# Patient Record
Sex: Male | Born: 1975
Health system: Southern US, Community
[De-identification: ages and names within clinical notes are randomized; demographics above are authoritative.]

## PROBLEM LIST (undated history)

## (undated) DIAGNOSIS — I10 Essential (primary) hypertension: Secondary | ICD-10-CM

## (undated) DIAGNOSIS — F419 Anxiety disorder, unspecified: Secondary | ICD-10-CM

## (undated) HISTORY — PX: TONSILLECTOMY: SHX5217

## (undated) HISTORY — PX: TONSILLECTOMY: SUR1361

## (undated) HISTORY — PX: ANKLE SURGERY: SHX546

## (undated) HISTORY — DX: Essential (primary) hypertension: I10

## (undated) HISTORY — DX: Anxiety disorder, unspecified: F41.9

---

## 2007-08-29 DIAGNOSIS — M549 Dorsalgia, unspecified: Secondary | ICD-10-CM | POA: Insufficient documentation

## 2011-02-12 DIAGNOSIS — G4761 Periodic limb movement disorder: Secondary | ICD-10-CM | POA: Insufficient documentation

## 2011-02-12 DIAGNOSIS — G4733 Obstructive sleep apnea (adult) (pediatric): Secondary | ICD-10-CM | POA: Insufficient documentation

## 2016-09-11 ENCOUNTER — Ambulatory Visit (INDEPENDENT_AMBULATORY_CARE_PROVIDER_SITE_OTHER): Payer: BLUE CROSS/BLUE SHIELD | Admitting: Family

## 2016-09-11 ENCOUNTER — Encounter: Payer: Self-pay | Admitting: Family

## 2016-09-11 VITALS — BP 143/98 | HR 83 | Temp 98.5°F | Ht 66.25 in | Wt 270.2 lb

## 2016-09-11 DIAGNOSIS — F17200 Nicotine dependence, unspecified, uncomplicated: Secondary | ICD-10-CM | POA: Diagnosis not present

## 2016-09-11 DIAGNOSIS — R350 Frequency of micturition: Secondary | ICD-10-CM | POA: Diagnosis not present

## 2016-09-11 DIAGNOSIS — R030 Elevated blood-pressure reading, without diagnosis of hypertension: Secondary | ICD-10-CM

## 2016-09-11 DIAGNOSIS — M545 Low back pain, unspecified: Secondary | ICD-10-CM | POA: Insufficient documentation

## 2016-09-11 DIAGNOSIS — E669 Obesity, unspecified: Secondary | ICD-10-CM

## 2016-09-11 DIAGNOSIS — G8929 Other chronic pain: Secondary | ICD-10-CM

## 2016-09-11 LAB — URINALYSIS, COMPLETE
BILIRUBIN UA: NEGATIVE
Glucose, UA: NEGATIVE
KETONES UA: NEGATIVE
LEUKOCYTES UA: NEGATIVE
Nitrite, UA: NEGATIVE
Specific Gravity, UA: >1.03 — ABNORMAL HIGH (ref 1.005–1.030)
Urobilinogen, Ur: 0.2 mg/dL (ref 0.2–1.0)
pH, UA: 5.5 (ref 5.0–7.5)

## 2016-09-11 LAB — MICROSCOPIC EXAMINATION: BACTERIA UA: NONE SEEN

## 2016-09-11 MED ORDER — CYCLOBENZAPRINE HCL 10 MG PO TABS: 10.0000 mg | ORAL_TABLET | Freq: Three times a day (TID) | ORAL | 0 refills | Status: DC | PRN

## 2016-09-11 MED ORDER — NAPROXEN 500 MG PO TABS: 500.0000 mg | ORAL_TABLET | Freq: Two times a day (BID) | ORAL | 1 refills | Status: DC

## 2016-09-11 MED ORDER — TAMSULOSIN HCL 0.4 MG PO CAPS: 0.4000 mg | ORAL_CAPSULE | Freq: Every day | ORAL | 1 refills | Status: DC

## 2016-09-11 NOTE — Patient Instructions (Signed)
Urinary Frequency, Adult Urinary frequency means urinating more often than usual. People with urinary frequency urinate at least 8 times in 24 hours, even if they drink a normal amount of fluid. Although they urinate more often than normal, the total amount of urine produced in a day may be normal. Urinary frequency is also called pollakiuria. What are the causes? This condition may be caused by:  A urinary tract infection.  Obesity.  Bladder problems, such as bladder stones.  Caffeine or alcohol.  Eating food or drinking fluids that irritate the bladder. These include coffee, tea, soda, artificial sweeteners, citrus, tomato-based foods, and chocolate.  Certain medicines, such as medicines that help the body get rid of extra fluid (diuretics).  Muscle or nerve weakness.  Overactive bladder.  Chronic diabetes.  Interstitial cystitis.  In men, problems with the prostate, such as an enlarged prostate.  In women, pregnancy. In some cases, the cause may not be known. What increases the risk? This condition is more likely to develop in:  Women who have gone through menopause.  Men with prostate problems.  People with a disease or injury that affects the nerves or spinal cord.  People who have or have had a condition that affects the brain, such as a stroke. What are the signs or symptoms? Symptoms of this condition include:  Feeling an urgent need to urinate often. The stress and anxiety of needing to find a bathroom quickly can make this urge worse.  Urinating 8 or more times in 24 hours.  Urinating as often as every 1 to 2 hours. How is this diagnosed? This condition is diagnosed based on your symptoms, your medical history, and a physical exam. You may have tests, such as:  Blood tests.  Urine tests.  Imaging tests, such as X-rays or ultrasounds.  A bladder test.  A test of your neurological system. This is the body system that senses the need to urinate.  A  test to check for problems in the urethra and bladder called cystoscopy. You may also be asked to keep a bladder diary. A bladder diary is a record of what you eat and drink, how often you urinate, and how much you urinate. You may need to see a health care provider who specializes in conditions of the urinary tract (urologist) or kidneys (nephrologist). How is this treated? Treatment for this condition depends on the cause. Sometimes the condition goes away on its own and treatment is not necessary. If treatment is needed, it may include:  Taking medicine.  Learning exercises that strengthen the muscles that help control urination.  Following a bladder training program. This may include:  Learning to delay going to the bathroom.  Double urinating (voiding). This helps if you are not completely emptying your bladder.  Scheduled voiding.  Making diet changes, such as:  Avoiding caffeine.  Drinking fewer fluids, especially alcohol.  Not drinking in the evening.  Not having foods or drinks that may irritate the bladder.  Eating foods that help prevent or ease constipation. Constipation can make this condition worse.  Having the nerves in your bladder stimulated. There are two options for stimulating the nerves to your bladder:  Outpatient electrical nerve stimulation. This is done by your health care provider.  Surgery to implant a bladder pacemaker. The pacemaker helps to control the urge to urinate. Follow these instructions at home:  Keep a bladder diary if told to by your health care provider.  Take over-the-counter and prescription medicines only as   told by your health care provider.  Do any exercises as told by your health care provider.  Follow a bladder training program as told by your health care provider.  Make any recommended diet changes.  Keep all follow-up visits as told by your health care provider. This is important. Contact a health care provider  if:  You start urinating more often.  You feel pain or irritation when you urinate.  You notice blood in your urine.  Your urine looks cloudy.  You develop a fever.  You begin vomiting. Get help right away if:  You are unable to urinate. This information is not intended to replace advice given to you by your health care provider. Make sure you discuss any questions you have with your health care provider. Document Released: 04/04/2009 Document Revised: 07/10/2015 Document Reviewed: 01/02/2015 Elsevier Interactive Patient Education  2017 [employer]  

## 2016-09-11 NOTE — Progress Notes (Signed)
Subjective:    Patient ID: Jonathan Moses, male    DOB: 05-Jun-1976, 41 y.o.   MRN: [id number]  Pt presents to the office today to establish care. PT complaining of urinary frequency and has taken flomax in the past, but has been out of it for the last month or so. PT's BP is elevated today, but states he has never had elevated BP in the past.  Back Pain  This is a new problem. The current episode started more than 1 year ago. The problem occurs intermittently. The problem has been waxing and waning since onset. The pain is present in the lumbar spine. The quality of the pain is described as aching. The pain does not radiate. The pain is at a severity of 7/10. The pain is moderate. The symptoms are aggravated by standing and bending. Pertinent negatives include no bladder incontinence, bowel incontinence, leg pain, numbness or weakness. Risk factors include obesity. He has tried NSAIDs for the symptoms. The treatment provided mild relief.  Urinary Frequency   This is a chronic problem. The current episode started more than 1 year ago. The problem occurs intermittently. The problem has been waxing and waning. The patient is experiencing no pain. Associated symptoms include frequency. Pertinent negatives include no hematuria, hesitancy, nausea, urgency or vomiting. Treatments tried: flomax. The treatment provided mild relief.      Review of Systems  Gastrointestinal: Negative for bowel incontinence, nausea and vomiting.  Genitourinary: Positive for frequency. Negative for bladder incontinence, hematuria, hesitancy and urgency.  Musculoskeletal: Positive for back pain.  Neurological: Negative for weakness and numbness.  All other systems reviewed and are negative.  Family History  Problem Relation Age of Onset  . Diabetes Mother   . Hypertension Mother   . Diabetes Father   . Hypertension Father     Social History   Social History  . Marital status: Single    Spouse name: N/A  .  Number of children: N/A  . Years of education: N/A   Social History Main Topics  . Smoking status: Current Every Day Smoker  . Smokeless tobacco: Never Used  . Alcohol use Yes  . Drug use: No  . Sexual activity: Not Asked   Other Topics Concern  . None   Social History Narrative  . None        Objective:   Physical Exam  Constitutional: He is oriented to person, place, and time. He appears well-developed and well-nourished. No distress.  Obese   HENT:  Head: Normocephalic.  Right Ear: External ear normal.  Left Ear: External ear normal.  Mouth/Throat: Oropharynx is clear and moist.  Eyes: Pupils are equal, round, and reactive to light. Right eye exhibits no discharge. Left eye exhibits no discharge.  Neck: Normal range of motion. Neck supple. No thyromegaly present.  Cardiovascular: Normal rate, regular rhythm, normal heart sounds and intact distal pulses.   No murmur heard. Pulmonary/Chest: Effort normal and breath sounds normal. No respiratory distress. He has no wheezes.  Abdominal: Soft. Bowel sounds are normal. He exhibits no distension. There is no tenderness.  Musculoskeletal: Normal range of motion. He exhibits no edema or tenderness.  Pain in lower back with flexion  Neurological: He is alert and oriented to person, place, and time.  Skin: Skin is warm and dry. No rash noted. No erythema.  Psychiatric: He has a normal mood and affect. His behavior is normal. Judgment and thought content normal.  Vitals reviewed.     BP Marland Kitchen)  143/98   Pulse 83   Temp 98.5 F (36.9 C) (Oral)   Ht 5' 6.25" (1.683 m)   Wt 270 lb 3.2 oz (122.6 kg)   BMI 43.28 kg/m      Assessment & Plan:  1. Urine frequency - Urinalysis, Complete - tamsulosin (FLOMAX) 0.4 MG CAPS capsule; Take 1 capsule (0.4 mg total) by mouth daily.  Dispense: 90 capsule; Refill: 1 - CMP14+EGFR  2. Current smoker - CMP14+EGFR  3. Obesity (BMI 30-39.9) - CMP14+EGFR  4. Chronic bilateral low back  pain without sciatica -Rest -Ice and heat - Urinalysis, Complete - cyclobenzaprine (FLEXERIL) 10 MG tablet; Take 1 tablet (10 mg total) by mouth 3 (three) times daily as needed for muscle spasms.  Dispense: 30 tablet; Refill: 0 - naproxen (NAPROSYN) 500 MG tablet; Take 1 tablet (500 mg total) by mouth 2 (two) times daily with a meal.  Dispense: 60 tablet; Refill: 1 - CMP14+EGFR  5. Elevated blood pressure reading - CMP14+EGFR   Continue all meds Labs pending Health Maintenance reviewed Diet and exercise encouraged RTO as needed and encouraged to make [place], FNP

## 2016-09-12 LAB — CMP14+EGFR
A/G RATIO: 1.2 (ref 1.2–2.2)
ALT: 49 IU/L — AB (ref 0–44)
AST: 36 IU/L (ref 0–40)
Albumin: 4.1 g/dL (ref 3.5–5.5)
Alkaline Phosphatase: 101 IU/L (ref 39–117)
BUN/Creatinine Ratio: 9 (ref 9–20)
BUN: 7 mg/dL (ref 6–20)
Bilirubin Total: 0.3 mg/dL (ref 0.0–1.2)
CHLORIDE: 102 mmol/L (ref 96–106)
CO2: 26 mmol/L (ref 18–29)
Calcium: 9.2 mg/dL (ref 8.7–10.2)
Creatinine, Ser: 0.76 mg/dL (ref 0.76–1.27)
GFR calc non Af Amer: 115 mL/min/{1.73_m2} (ref >59)
GFR, EST AFRICAN AMERICAN: 133 mL/min/{1.73_m2} (ref >59)
GLOBULIN, TOTAL: 3.4 g/dL (ref 1.5–4.5)
Glucose: 89 mg/dL (ref 65–99)
POTASSIUM: 4.3 mmol/L (ref 3.5–5.2)
SODIUM: 142 mmol/L (ref 134–144)
Total Protein: 7.5 g/dL (ref 6.0–8.5)

## 2016-10-31 ENCOUNTER — Other Ambulatory Visit: Payer: Self-pay | Admitting: Family

## 2016-10-31 DIAGNOSIS — G8929 Other chronic pain: Secondary | ICD-10-CM

## 2016-10-31 DIAGNOSIS — M545 Low back pain: Principal | ICD-10-CM

## 2016-11-06 ENCOUNTER — Encounter: Payer: Self-pay | Admitting: *Deleted

## 2016-12-14 ENCOUNTER — Encounter: Payer: BLUE CROSS/BLUE SHIELD | Admitting: Family

## 2016-12-15 ENCOUNTER — Other Ambulatory Visit: Payer: Self-pay | Admitting: Family

## 2016-12-15 ENCOUNTER — Encounter: Payer: Self-pay | Admitting: Family

## 2016-12-15 ENCOUNTER — Ambulatory Visit (INDEPENDENT_AMBULATORY_CARE_PROVIDER_SITE_OTHER): Payer: BLUE CROSS/BLUE SHIELD | Admitting: Family

## 2016-12-15 ENCOUNTER — Ambulatory Visit (INDEPENDENT_AMBULATORY_CARE_PROVIDER_SITE_OTHER): Payer: BLUE CROSS/BLUE SHIELD

## 2016-12-15 VITALS — BP 153/103 | HR 94 | Temp 98.3°F | Ht 66.25 in | Wt 270.2 lb

## 2016-12-15 DIAGNOSIS — Z0000 Encounter for general adult medical examination without abnormal findings: Secondary | ICD-10-CM | POA: Diagnosis not present

## 2016-12-15 DIAGNOSIS — R0602 Shortness of breath: Secondary | ICD-10-CM | POA: Diagnosis not present

## 2016-12-15 DIAGNOSIS — F17200 Nicotine dependence, unspecified, uncomplicated: Secondary | ICD-10-CM

## 2016-12-15 DIAGNOSIS — M545 Low back pain: Secondary | ICD-10-CM

## 2016-12-15 DIAGNOSIS — Z23 Encounter for immunization: Secondary | ICD-10-CM | POA: Diagnosis not present

## 2016-12-15 DIAGNOSIS — G8929 Other chronic pain: Secondary | ICD-10-CM

## 2016-12-15 DIAGNOSIS — I10 Essential (primary) hypertension: Secondary | ICD-10-CM

## 2016-12-15 MED ORDER — CYCLOBENZAPRINE HCL 10 MG PO TABS: 10.0000 mg | ORAL_TABLET | Freq: Three times a day (TID) | ORAL | 2 refills | Status: DC | PRN

## 2016-12-15 MED ORDER — LISINOPRIL 20 MG PO TABS: 20.0000 mg | ORAL_TABLET | Freq: Every day | ORAL | 3 refills | Status: DC

## 2016-12-15 MED ORDER — NAPROXEN 500 MG PO TABS: 500.0000 mg | ORAL_TABLET | Freq: Two times a day (BID) | ORAL | 3 refills | Status: DC

## 2016-12-15 NOTE — Patient Instructions (Signed)

## 2016-12-15 NOTE — Progress Notes (Signed)
Subjective:    Patient ID: Jonathan Moses, male    DOB: 02-27-1976, 41 y.o.   MRN: [id number]  Pt presents to the office today for CPE.  Hypertension  This is a new problem. The current episode started more than 1 month ago. The problem is unchanged. The problem is uncontrolled. Associated symptoms include shortness of breath. Pertinent negatives include no palpitations or peripheral edema. Risk factors for coronary artery disease include obesity and family history. Past treatments include nothing. The current treatment provides no improvement. There is no history of kidney disease, CAD/MI, CVA or heart failure.  Back Pain  This is a chronic problem. The current episode started more than 1 year ago. The problem occurs intermittently. The problem has been waxing and waning since onset. The pain is present in the lumbar spine. The quality of the pain is described as aching. The pain is at a severity of 7/10. The pain is moderate. Pertinent negatives include no bladder incontinence or bowel incontinence. He has tried muscle relaxant for the symptoms. The treatment provided mild relief.  Benign Prostatic Hypertrophy  This is a recurrent problem. The current episode started more than 1 year ago. The problem has been waxing and waning since onset. Irritative symptoms include frequency and nocturia (1). Obstructive symptoms include a slower stream and straining.      Review of Systems  Respiratory: Positive for shortness of breath.   Cardiovascular: Negative for palpitations.  Gastrointestinal: Negative for bowel incontinence.  Genitourinary: Positive for frequency and nocturia (1). Negative for bladder incontinence.  Musculoskeletal: Positive for back pain.  All other systems reviewed and are negative.      Objective:   Physical Exam  Constitutional: He is oriented to person, place, and time. He appears well-developed and well-nourished. No distress.  HENT:  Head: Normocephalic.  Right  Ear: External ear normal.  Left Ear: External ear normal.  Nose: Nose normal.  Mouth/Throat: Oropharynx is clear and moist.  Eyes: Pupils are equal, round, and reactive to light. Right eye exhibits no discharge. Left eye exhibits no discharge.  Neck: Normal range of motion. Neck supple. No thyromegaly present.  Cardiovascular: Normal rate, regular rhythm, normal heart sounds and intact distal pulses.   No murmur heard. Pulmonary/Chest: Effort normal and breath sounds normal. No respiratory distress. He has no wheezes.  Abdominal: Soft. Bowel sounds are normal. He exhibits no distension. There is no tenderness.  Musculoskeletal: Normal range of motion. He exhibits no edema or tenderness.  Neurological: He is alert and oriented to person, place, and time. He has normal reflexes. No cranial nerve deficit.  Skin: Skin is warm and dry. No rash noted. No erythema.  Psychiatric: He has a normal mood and affect. His behavior is normal. Judgment and thought content normal.  Vitals reviewed.    BP (!) 153/103   Pulse 94   Temp 98.3 F (36.8 C) (Oral)   Ht 5' 6.25" (1.683 m)   Wt 270 lb 3.2 oz (122.6 kg)   BMI 43.28 kg/m      Assessment & Plan:  1. Annual physical exam - CMP14+EGFR - Lipid panel - CBC with Differential/Platelet - VITAMIN D 25 Hydroxy (Vit-D Deficiency, Fractures) - Thyroid Panel With TSH - PSA, total and free - DG Chest 2 View; Future  2. Current smoker - CMP14+EGFR - DG Chest 2 View; Future  3. Chronic bilateral low back pain without sciatica - CMP14+EGFR  4. Morbid obesity (HCC) - CMP14+EGFR  5. SOB (shortness of breath) -  DG Chest 2 View; Future  6. Essential hypertension -Pt started on lisinopril 20 mg today -Dash diet information given -Exercise encouraged - Stress Management  -Continue current meds -RTO in 2 weeks - lisinopril (PRINIVIL,ZESTRIL) 20 MG tablet; Take 1 tablet (20 mg total) by mouth daily.  Dispense: 90 tablet; Refill: 3   Continue  all meds Labs pending Health Maintenance reviewed Diet and exercise encouraged RTO 2 weeks to recheck HTN  Christy Hawks, FNP  

## 2016-12-15 NOTE — Addendum Note (Signed)
Addended by: Jannifer RodneyHAWKS, Nasiah Polinsky A on: 12/15/2016 03:46 PM   Modules accepted: Orders

## 2016-12-15 NOTE — Addendum Note (Signed)
Addended by: Almeta MonasSTONE, Bonne Whack M on: 12/15/2016 04:11 PM   Modules accepted: Orders

## 2016-12-16 ENCOUNTER — Other Ambulatory Visit: Payer: Self-pay | Admitting: Family

## 2016-12-16 DIAGNOSIS — E559 Vitamin D deficiency, unspecified: Secondary | ICD-10-CM | POA: Insufficient documentation

## 2016-12-16 LAB — CMP14+EGFR
ALT: 66 IU/L — AB (ref 0–44)
AST: 50 IU/L — AB (ref 0–40)
Albumin/Globulin Ratio: 1 — ABNORMAL LOW (ref 1.2–2.2)
Albumin: 3.7 g/dL (ref 3.5–5.5)
Alkaline Phosphatase: 105 IU/L (ref 39–117)
BUN/Creatinine Ratio: 11 (ref 9–20)
BUN: 9 mg/dL (ref 6–20)
Bilirubin Total: 0.3 mg/dL (ref 0.0–1.2)
CALCIUM: 8.9 mg/dL (ref 8.7–10.2)
CO2: 21 mmol/L (ref 20–29)
CREATININE: 0.79 mg/dL (ref 0.76–1.27)
Chloride: 101 mmol/L (ref 96–106)
GFR calc non Af Amer: 113 mL/min/{1.73_m2} (ref >59)
GFR, EST AFRICAN AMERICAN: 131 mL/min/{1.73_m2} (ref >59)
Globulin, Total: 3.8 g/dL (ref 1.5–4.5)
Glucose: 86 mg/dL (ref 65–99)
Potassium: 3.6 mmol/L (ref 3.5–5.2)
Sodium: 140 mmol/L (ref 134–144)
TOTAL PROTEIN: 7.5 g/dL (ref 6.0–8.5)

## 2016-12-16 LAB — CBC WITH DIFFERENTIAL/PLATELET
BASOS ABS: 0 10*3/uL (ref 0.0–0.2)
Basos: 1 %
EOS (ABSOLUTE): 0.2 10*3/uL (ref 0.0–0.4)
Eos: 7 %
Hematocrit: 44.5 % (ref 37.5–51.0)
Hemoglobin: 14.8 g/dL (ref 13.0–17.7)
Immature Grans (Abs): 0 10*3/uL (ref 0.0–0.1)
Immature Granulocytes: 0 %
LYMPHS ABS: 1 10*3/uL (ref 0.7–3.1)
Lymphs: 29 %
MCH: 30.5 pg (ref 26.6–33.0)
MCHC: 33.3 g/dL (ref 31.5–35.7)
MCV: 92 fL (ref 79–97)
Monocytes Absolute: 0.7 10*3/uL (ref 0.1–0.9)
Monocytes: 20 %
NEUTROS ABS: 1.4 10*3/uL (ref 1.4–7.0)
Neutrophils: 43 %
PLATELETS: 192 10*3/uL (ref 150–379)
RBC: 4.86 x10E6/uL (ref 4.14–5.80)
RDW: 14 % (ref 12.3–15.4)
WBC: 3.3 10*3/uL — AB (ref 3.4–10.8)

## 2016-12-16 LAB — THYROID PANEL WITH TSH
Free Thyroxine Index: 1.7 (ref 1.2–4.9)
T3 Uptake Ratio: 26 % (ref 24–39)
T4, Total: 6.7 ug/dL (ref 4.5–12.0)
TSH: 2.25 u[IU]/mL (ref 0.450–4.500)

## 2016-12-16 LAB — VITAMIN D 25 HYDROXY (VIT D DEFICIENCY, FRACTURES): VIT D 25 HYDROXY: 13.9 ng/mL — AB (ref 30.0–100.0)

## 2016-12-16 LAB — PSA, TOTAL AND FREE
PSA FREE PCT: 18.6 %
PSA FREE: 0.13 ng/mL
Prostate Specific Ag, Serum: 0.7 ng/mL (ref 0.0–4.0)

## 2016-12-16 LAB — LIPID PANEL
CHOL/HDL RATIO: 5.8 ratio — AB (ref 0.0–5.0)
Cholesterol, Total: 198 mg/dL (ref 100–199)
HDL: 34 mg/dL — AB (ref >39)
TRIGLYCERIDES: 501 mg/dL — AB (ref 0–149)

## 2016-12-16 MED ORDER — ROSUVASTATIN CALCIUM 20 MG PO TABS: 20.0000 mg | ORAL_TABLET | Freq: Every day | ORAL | 1 refills | Status: DC

## 2016-12-31 ENCOUNTER — Ambulatory Visit (INDEPENDENT_AMBULATORY_CARE_PROVIDER_SITE_OTHER): Payer: BLUE CROSS/BLUE SHIELD | Admitting: Family

## 2016-12-31 ENCOUNTER — Encounter: Payer: Self-pay | Admitting: Family

## 2016-12-31 VITALS — BP 137/98 | HR 109 | Temp 98.0°F | Ht 66.25 in | Wt 265.6 lb

## 2016-12-31 DIAGNOSIS — M545 Low back pain, unspecified: Secondary | ICD-10-CM

## 2016-12-31 DIAGNOSIS — F17200 Nicotine dependence, unspecified, uncomplicated: Secondary | ICD-10-CM

## 2016-12-31 DIAGNOSIS — I10 Essential (primary) hypertension: Secondary | ICD-10-CM | POA: Diagnosis not present

## 2016-12-31 DIAGNOSIS — G8929 Other chronic pain: Secondary | ICD-10-CM | POA: Diagnosis not present

## 2016-12-31 MED ORDER — DICLOFENAC SODIUM 75 MG PO TBEC: 75.0000 mg | DELAYED_RELEASE_TABLET | Freq: Two times a day (BID) | ORAL | 3 refills | Status: DC

## 2016-12-31 MED ORDER — LISINOPRIL-HYDROCHLOROTHIAZIDE 20-12.5 MG PO TABS: 1.0000 | ORAL_TABLET | Freq: Every day | ORAL | 3 refills | Status: DC

## 2016-12-31 NOTE — Patient Instructions (Signed)

## 2016-12-31 NOTE — Progress Notes (Signed)
   Subjective:    Patient ID: Jonathan Moses, male    DOB: 1975/09/15, 41 y.o.   MRN: [id number]  PT presents to the office today to recheck HTN.  Hypertension  This is a chronic problem. The current episode started more than 1 year ago. The problem has been waxing and waning since onset. The problem is uncontrolled. Pertinent negatives include no peripheral edema or shortness of breath. Risk factors for coronary artery disease include dyslipidemia, obesity and sedentary lifestyle. Past treatments include ACE inhibitors. The current treatment provides mild improvement. There is no history of kidney disease or heart failure.  Back Pain  This is a chronic problem. The current episode started more than 1 year ago. The problem occurs intermittently. The problem has been waxing and waning since onset. The pain is present in the lumbar spine. The quality of the pain is described as aching. The pain is at a severity of 8/10. The pain is moderate. The pain is worse during the night. The symptoms are aggravated by lying down. Pertinent negatives include no bladder incontinence or bowel incontinence.      Review of Systems  Respiratory: Negative for shortness of breath.   Gastrointestinal: Negative for bowel incontinence.  Genitourinary: Negative for bladder incontinence.  Musculoskeletal: Positive for back pain.  All other systems reviewed and are negative.      Objective:   Physical Exam  Constitutional: He is oriented to person, place, and time. He appears well-developed and well-nourished. No distress.  HENT:  Head: Normocephalic.  Eyes: Pupils are equal, round, and reactive to light. Right eye exhibits no discharge. Left eye exhibits no discharge.  Neck: Normal range of motion. Neck supple. No thyromegaly present.  Cardiovascular: Normal rate, regular rhythm, normal heart sounds and intact distal pulses.   No murmur heard. Pulmonary/Chest: Effort normal and breath sounds normal. No  respiratory distress. He has no wheezes.  Abdominal: Soft. Bowel sounds are normal. He exhibits no distension. There is no tenderness.  Musculoskeletal: Normal range of motion. He exhibits no edema or tenderness.  Neurological: He is alert and oriented to person, place, and time.  Skin: Skin is warm and dry. No rash noted. No erythema.  Psychiatric: He has a normal mood and affect. His behavior is normal. Judgment and thought content normal.  Vitals reviewed.     BP (!) 145/98   Pulse (!) 107   Temp 98 F (36.7 C) (Oral)   Ht 5' 6.25" (1.683 m)   Wt 265 lb 9.6 oz (120.5 kg)   BMI 42.55 kg/m      Assessment & Plan:  1. Essential hypertension HCTZ 12.5 added today  -Dash diet information given -Exercise encouraged - Stress Management  -Continue current meds -RTO in 2 weeks  - lisinopril-hydrochlorothiazide (ZESTORETIC) 20-12.5 MG tablet; Take 1 tablet by mouth daily.  Dispense: 90 tablet; Refill: 3 - BMP8+EGFR  2. Current smoker Smoking cessation discussed  - BMP8+EGFR    3. Chronic bilateral low back pain without sciatica Rest Ice and heat - diclofenac (VOLTAREN) 75 MG EC tablet; Take 1 tablet (75 mg total) by mouth 2 (two) times daily.  Dispense: 30 tablet; Refill: [place], FNP

## 2017-01-01 LAB — BMP8+EGFR
BUN / CREAT RATIO: 16 (ref 9–20)
BUN: 14 mg/dL (ref 6–20)
CHLORIDE: 98 mmol/L (ref 96–106)
CO2: 22 mmol/L (ref 20–29)
Calcium: 9.8 mg/dL (ref 8.7–10.2)
Creatinine, Ser: 0.85 mg/dL (ref 0.76–1.27)
GFR calc Af Amer: 127 mL/min/{1.73_m2} (ref >59)
GFR calc non Af Amer: 110 mL/min/{1.73_m2} (ref >59)
GLUCOSE: 108 mg/dL — AB (ref 65–99)
POTASSIUM: 4.5 mmol/L (ref 3.5–5.2)
Sodium: 136 mmol/L (ref 134–144)

## 2017-01-14 ENCOUNTER — Ambulatory Visit (INDEPENDENT_AMBULATORY_CARE_PROVIDER_SITE_OTHER): Payer: BLUE CROSS/BLUE SHIELD | Admitting: *Deleted

## 2017-01-14 VITALS — BP 128/91 | HR 89

## 2017-01-14 DIAGNOSIS — I10 Essential (primary) hypertension: Secondary | ICD-10-CM

## 2017-01-14 NOTE — Patient Instructions (Signed)

## 2017-01-14 NOTE — Progress Notes (Signed)
Patient is here today for a two week BP check. Patients BP (!) 128/91   Pulse 89 . Patient advised if you wanted to make any changes we would contact him.

## 2017-04-05 ENCOUNTER — Encounter: Payer: Self-pay | Admitting: Family

## 2017-04-05 ENCOUNTER — Ambulatory Visit (INDEPENDENT_AMBULATORY_CARE_PROVIDER_SITE_OTHER): Payer: BLUE CROSS/BLUE SHIELD | Admitting: Family

## 2017-04-05 VITALS — BP 145/96 | HR 82 | Temp 98.2°F | Ht 66.25 in | Wt 266.8 lb

## 2017-04-05 DIAGNOSIS — I10 Essential (primary) hypertension: Secondary | ICD-10-CM | POA: Diagnosis not present

## 2017-04-05 DIAGNOSIS — Z09 Encounter for follow-up examination after completed treatment for conditions other than malignant neoplasm: Secondary | ICD-10-CM

## 2017-04-05 DIAGNOSIS — N179 Acute kidney failure, unspecified: Secondary | ICD-10-CM

## 2017-04-05 MED ORDER — AMLODIPINE BESYLATE 5 MG PO TABS: 5.0000 mg | ORAL_TABLET | Freq: Every day | ORAL | 3 refills | Status: DC

## 2017-04-05 NOTE — Progress Notes (Signed)
   Subjective:    Patient ID: Jonathan Moses, male    DOB: 1975/09/30, 41 y.o.   MRN: [id number]  Pt presents to the office today for hospital follow up. Pt went to the ED on 02/25/17 for body cramping and sweating.   PT states he was told he AKI from taking Voltaren, naprosyn, and the lisinopril-HCTZ 20-121.5 mg. PT was taken off all these medications and told to continue this Norvasc 5 mg. Pt states he has been out of his Norvasc for the last week. Hypertension  This is a chronic problem. The current episode started more than 1 year ago. The problem has been waxing and waning since onset. The problem is uncontrolled. Pertinent negatives include no blurred vision, headaches, palpitations, peripheral edema, shortness of breath or sweats. Risk factors for coronary artery disease include family history, obesity and male gender. The current treatment provides no improvement. There is no history of kidney disease, CAD/MI, CVA or heart failure.     [facility] notes reviewed  Review of Systems  Eyes: Negative for blurred vision.  Respiratory: Negative for shortness of breath.   Cardiovascular: Negative for palpitations.  Neurological: Negative for headaches.  All other systems reviewed and are negative.      Objective:   Physical Exam  Constitutional: He is oriented to person, place, and time. He appears well-developed and well-nourished. No distress.  HENT:  Head: Normocephalic.  Right Ear: External ear normal.  Left Ear: External ear normal.  Mouth/Throat: Oropharynx is clear and moist.  Eyes: Pupils are equal, round, and reactive to light. Right eye exhibits no discharge. Left eye exhibits no discharge.  Neck: Normal range of motion. Neck supple. No thyromegaly present.  Cardiovascular: Normal rate, regular rhythm, normal heart sounds and intact distal pulses.   No murmur heard. Pulmonary/Chest: Effort normal and breath sounds normal. No respiratory distress. He has no wheezes.    Abdominal: Soft. Bowel sounds are normal. He exhibits no distension. There is no tenderness.  Musculoskeletal: Normal range of motion. He exhibits no edema or tenderness.  Neurological: He is alert and oriented to person, place, and time.  Skin: Skin is warm and dry. No rash noted. No erythema.  Psychiatric: He has a normal mood and affect. His behavior is normal. Judgment and thought content normal.  Vitals reviewed.     BP (!) 145/96   Pulse 82   Temp 98.2 F (36.8 C) (Oral)   Ht 5' 6.25" (1.683 m)   Wt 266 lb 12.8 oz (121 kg)   BMI 42.74 kg/m      Assessment & Plan:  1. AKI (acute kidney injury) ([place]) - CMP14+EGFR  2. Hospital discharge follow-up - CMP14+EGFR  3. Essential hypertension - amLODipine (NORVASC) 5 MG tablet; Take 1 tablet (5 mg total) by mouth daily.  Dispense: 90 tablet; Refill: 3 - CMP14+EGFR  Labs pending Will hold Lisinopril at this time Will reorder Norvasc 5 mg today -Dash diet information given -Exercise encouraged - Stress Management  -Continue current meds -RTO in 1 month   Evelina Dun, FNP

## 2017-04-05 NOTE — Patient Instructions (Signed)
Acute Kidney Injury, Adult Acute kidney injury is a sudden worsening of kidney function. The kidneys are organs that have several jobs. They filter the blood to remove waste products and extra fluid. They also maintain a healthy balance of minerals and hormones in the body, which helps control blood pressure and keep bones strong. With this condition, your kidneys do not do their jobs as well as they should. This condition ranges from mild to severe. Over time it may develop into long-lasting (chronic) kidney disease. Early detection and treatment may prevent acute kidney injury from developing into a chronic condition. What are the causes? Common causes of this condition include:  A problem with blood flow to the kidneys. This may be caused by:  Low blood pressure (hypotension) or shock.  Blood loss.  Heart and blood vessel (cardiovascular) disease.  Severe burns.  Liver disease.  Direct damage to the kidneys. This may be caused by:  Certain medicines.  A kidney infection.  Poisoning.  Being around or in contact with toxic substances.  A surgical wound.  A hard, direct hit to the kidney area.  A sudden blockage of urine flow. This may be caused by:  Cancer.  Kidney stones.  An enlarged prostate in males. What are the signs or symptoms? Symptoms of this condition may not be obvious until the condition becomes severe. Symptoms of this condition can include:  Tiredness (lethargy), or difficulty staying awake.  Nausea or vomiting.  Swelling (edema) of the face, legs, ankles, or feet.  Problems with urination, such as:  Abdominal pain, or pain along the side of your stomach (flank).  Decreased urine production.  Decrease in the force of urine flow.  Muscle twitches and cramps, especially in the legs.  Confusion or trouble concentrating.  Loss of appetite.  Fever. How is this diagnosed? This condition may be diagnosed with tests, including:  Blood  tests.  Urine tests.  Imaging tests.  A test in which a sample of tissue is removed from the kidneys to be examined under a microscope (kidney biopsy). How is this treated? Treatment for this condition depends on the cause and how severe the condition is. In mild cases, treatment may not be needed. The kidneys may heal on their own. In more severe cases, treatment will involve:  Treating the cause of the kidney injury. This may involve changing any medicines you are taking or adjusting your dosage.  Fluids. You may need specialized IV fluids to balance your body's needs.  Having a catheter placed to drain urine and prevent blockages.  Preventing problems from occurring. This may mean avoiding certain medicines or procedures that can cause further injury to the kidneys. In some cases treatment may also require:  A procedure to remove toxic wastes from the body (dialysis or continuous renal replacement therapy - CRRT).  Surgery. This may be done to repair a torn kidney, or to remove the blockage from the urinary system. Follow these instructions at home: Medicines   Take over-the-counter and prescription medicines only as told by your health care provider.  Do not take any new medicines without your health care provider's approval. Many medicines can worsen your kidney damage.  Do not take any vitamin and mineral supplements without your health care provider's approval. Many nutritional supplements can worsen your kidney damage. Lifestyle   If your health care provider prescribed changes to your diet, follow them. You may need to decrease the amount of protein you eat.  Achieve and maintain a   healthy weight. If you need help with this, ask your health care provider.  Start or continue an exercise plan. Try to exercise at least 30 minutes a day, 5 days a week.  Do not use any tobacco products, such as cigarettes, chewing tobacco, and e-cigarettes. If you need help quitting, ask  your health care provider. General instructions   Keep track of your blood pressure. Report changes in your blood pressure as told by your health care provider.  Stay up to date with immunizations. Ask your health care provider which immunizations you need.  Keep all follow-up visits as told by your health care provider. This is important. Where to find more information:  American Association of Kidney Patients: www.aakp.org  National Kidney Foundation: www.kidney.org  American Kid[employer] Fund: www.akfinc.org  [employer] [facility]:  www.lifeoptions.org  www.kidneyschool.org Contact a health care provider if:  Your symptoms get worse.  You develop new symptoms. Get help right away if:  You develop symptoms of worsening kidney disease, which include:  Headaches.  Abnormally dark or light skin.  Easy bruising.  Frequent hiccups.  Chest pain.  Shortness of breath.  End of menstruation in women.  Seizures.  Confusion or altered mental status.  Abdominal or back pain.  Itchiness.  You have a fever.  Your body is producing less urine.  You have pain or bleeding when you urinate. Summary  Acute kidney injury is a sudden worsening of kidney function.  Acute kidney injury can be caused by problems with blood flow to the kidneys, direct damage to the kidneys, and sudden blockage of urine flow.  Symptoms of this condition may not be obvious until it becomes severe. Symptoms may include edema, lethargy, confusion, nausea or vomiting, and problems passing urine.  This condition can usually be diagnosed with blood tests, urine tests, and imaging tests. Sometimes a kidney biopsy is done to diagnose this condition.  Treatment for this condition often involves treating the underlying cause. It is treated with fluids, medicines, dialysis, diet changes, or surgery. This information is not intended to replace advice given to you by your health care provider.  Make sure you discuss any questions you have with your health care provider. Document Released: 12/22/2010 Document Revised: 05/29/2016 Document Reviewed: 05/29/2016 Elsevier Interactive Patient Education  2017 Elsevier Inc.  

## 2017-04-06 LAB — CMP14+EGFR
A/G RATIO: 1.3 (ref 1.2–2.2)
ALT: 29 IU/L (ref 0–44)
AST: 24 IU/L (ref 0–40)
Albumin: 4 g/dL (ref 3.5–5.5)
Alkaline Phosphatase: 84 IU/L (ref 39–117)
BILIRUBIN TOTAL: 0.3 mg/dL (ref 0.0–1.2)
BUN/Creatinine Ratio: 9 (ref 9–20)
BUN: 7 mg/dL (ref 6–24)
CALCIUM: 9 mg/dL (ref 8.7–10.2)
CHLORIDE: 101 mmol/L (ref 96–106)
CO2: 21 mmol/L (ref 20–29)
Creatinine, Ser: 0.74 mg/dL — ABNORMAL LOW (ref 0.76–1.27)
GFR calc non Af Amer: 115 mL/min/{1.73_m2} (ref >59)
GFR, EST AFRICAN AMERICAN: 133 mL/min/{1.73_m2} (ref >59)
GLUCOSE: 90 mg/dL (ref 65–99)
Globulin, Total: 3.2 g/dL (ref 1.5–4.5)
POTASSIUM: 4.1 mmol/L (ref 3.5–5.2)
Sodium: 140 mmol/L (ref 134–144)
TOTAL PROTEIN: 7.2 g/dL (ref 6.0–8.5)

## 2017-04-09 ENCOUNTER — Other Ambulatory Visit: Payer: Self-pay | Admitting: Family

## 2017-04-09 DIAGNOSIS — R350 Frequency of micturition: Secondary | ICD-10-CM

## 2017-09-16 ENCOUNTER — Ambulatory Visit: Payer: BLUE CROSS/BLUE SHIELD | Admitting: Nurse Practitioner

## 2017-09-16 ENCOUNTER — Encounter: Payer: Self-pay | Admitting: Nurse Practitioner

## 2017-09-16 VITALS — BP 134/92 | HR 85 | Temp 98.0°F | Ht 66.0 in | Wt 274.0 lb

## 2017-09-16 DIAGNOSIS — G8929 Other chronic pain: Secondary | ICD-10-CM | POA: Diagnosis not present

## 2017-09-16 DIAGNOSIS — M545 Low back pain: Secondary | ICD-10-CM

## 2017-09-16 DIAGNOSIS — R109 Unspecified abdominal pain: Secondary | ICD-10-CM

## 2017-09-16 MED ORDER — PREDNISONE 10 MG (21) PO TBPK: ORAL_TABLET | ORAL | 0 refills | Status: DC

## 2017-09-16 MED ORDER — CYCLOBENZAPRINE HCL 10 MG PO TABS: 10.0000 mg | ORAL_TABLET | Freq: Three times a day (TID) | ORAL | 0 refills | Status: DC | PRN

## 2017-09-16 NOTE — Progress Notes (Addendum)
   Subjective:    Patient ID: Jonathan Moses, male    DOB: 03-04-1976, 42 y.o.   MRN: [id number]  HPI  Patient comes in today c/o left flank pain that started 2 weeks ago. Has gotten worse. Describes pain as stabbing. Rates pain 9/10. movement increases pain. Sitting still decreases pain. No urinary symptoms. Denies injury   Review of Systems  Constitutional: Negative for chills and fever.  HENT: Negative.   Gastrointestinal: Negative.  Negative for blood in stool, constipation, diarrhea, nausea and vomiting.  Genitourinary: Positive for dysuria, frequency, hematuria and urgency.  Neurological: Negative.   Psychiatric/Behavioral: Negative.   All other systems reviewed and are negative.      Objective:   Physical Exam  Constitutional: He is oriented to person, place, and time. He appears well-developed and well-nourished. He appears distressed (mild).  Cardiovascular: Normal rate and regular rhythm.  Pulmonary/Chest: Effort normal and breath sounds normal.  Abdominal: Soft. Bowel sounds are normal. He exhibits no mass. There is no tenderness. There is no guarding.  Genitourinary:  Genitourinary Comments: NO CVA tenderness  Musculoskeletal:  FROM of lumbar spine with pain on rotation and flexion No point tenderness on palpation of left flank  Neurological: He is alert and oriented to person, place, and time. He has normal reflexes. No cranial nerve deficit.  Skin: Skin is warm. No rash noted.  Psychiatric: He has a normal mood and affect. His behavior is normal. Judgment and thought content normal.  Vitals reviewed.  BP (!) 134/92   Pulse 85   Temp 98 F (36.7 C) (Oral)   Ht 5\' 6"  (1.676 m)   Wt 274 lb (124.3 kg)   BMI 44.22 kg/m         Assessment & Plan:   1. Acute left flank pain    Probably muscular in nature Moist heat Rest Stretching encouraged  Meds ordered this encounter  Medications  . predniSONE (STERAPRED UNI-PAK 21 TAB) 10 MG (21) TBPK tablet   Sig: As directed x 6 days    Dispense:  21 tablet    Refill:  0    Order Specific Question:   Supervising Provider    Answer:   Johna SheriffVINCENT, CAROL L [[id number]]   Mary-Margaret Daphine DeutscherMartin, FNP

## 2017-09-16 NOTE — Addendum Note (Signed)
Addended by: Bennie PieriniMARTIN, MARY-MARGARET on: 09/16/2017 11:27 AM   Modules accepted: Orders

## 2017-09-16 NOTE — Patient Instructions (Signed)
Flank Pain Flank pain is pain in your side. The flank is the area of your side between your upper belly (abdomen) and your back. The pain may occur over a short period of time (acute) or may be long-term or come back often (chronic). It may be mild or very bad. Pain in this area can be caused by many different things. Follow these instructions at home:  Rest as told by your doctor.  Drink enough fluid to keep your pee (urine) clear or pale yellow.  Take over-the-counter and prescription medicines only as told by your doctor.  Keep all follow-up visits as told by your doctor. This is important. Contact a doctor if:  Medicine does not help your pain.  You have new symptoms.  Your pain gets worse.  You have a fever.  Your symptoms last longer than 2-3 days. Get help right away if:  Your tummy hurts or is swollen.  You are short of breath.  You feel sick to your stomach (nauseous) and it does not go away.  You cannot stop throwing up (vomiting).  You feel like you will pass out or you do pass out (faint).  You have blood in your pee.  You have a fever and your symptoms suddenly get worse. This information is not intended to replace advice given to you by your health care provider. Make sure you discuss any questions you have with your health care provider. Document Released: 03/17/2008 Document Revised: 02/28/2016 Document Reviewed: 03/12/2015 Elsevier Interactive Patient Education  2018 Elsevier Inc.  

## 2017-09-20 ENCOUNTER — Telehealth: Payer: Self-pay | Admitting: Family

## 2017-09-20 DIAGNOSIS — M545 Low back pain: Principal | ICD-10-CM

## 2017-09-20 DIAGNOSIS — G8929 Other chronic pain: Secondary | ICD-10-CM

## 2017-09-20 NOTE — Telephone Encounter (Signed)
[facility]

## 2017-09-20 NOTE — Telephone Encounter (Signed)
Notified of recommendation Verbalizes understanding 

## 2018-01-21 ENCOUNTER — Other Ambulatory Visit: Payer: Self-pay | Admitting: Family

## 2018-01-21 MED ORDER — AMLODIPINE BESYLATE 5 MG PO TABS: 5.0000 mg | ORAL_TABLET | Freq: Every day | ORAL | 0 refills | Status: DC

## 2018-06-08 ENCOUNTER — Ambulatory Visit: Payer: Commercial Managed Care - PPO | Admitting: Family

## 2018-06-08 ENCOUNTER — Encounter: Payer: Self-pay | Admitting: Family

## 2018-06-08 VITALS — BP 144/84 | HR 102 | Temp 98.1°F | Ht 66.0 in | Wt 292.0 lb

## 2018-06-08 DIAGNOSIS — G8929 Other chronic pain: Secondary | ICD-10-CM

## 2018-06-08 DIAGNOSIS — E559 Vitamin D deficiency, unspecified: Secondary | ICD-10-CM | POA: Diagnosis not present

## 2018-06-08 DIAGNOSIS — I10 Essential (primary) hypertension: Secondary | ICD-10-CM | POA: Diagnosis not present

## 2018-06-08 DIAGNOSIS — M545 Low back pain: Secondary | ICD-10-CM | POA: Diagnosis not present

## 2018-06-08 DIAGNOSIS — Z0001 Encounter for general adult medical examination with abnormal findings: Secondary | ICD-10-CM | POA: Diagnosis not present

## 2018-06-08 DIAGNOSIS — F17200 Nicotine dependence, unspecified, uncomplicated: Secondary | ICD-10-CM

## 2018-06-08 DIAGNOSIS — Z0000 Encounter for general adult medical examination without abnormal findings: Secondary | ICD-10-CM

## 2018-06-08 DIAGNOSIS — N401 Enlarged prostate with lower urinary tract symptoms: Secondary | ICD-10-CM

## 2018-06-08 DIAGNOSIS — E785 Hyperlipidemia, unspecified: Secondary | ICD-10-CM

## 2018-06-08 DIAGNOSIS — R3914 Feeling of incomplete bladder emptying: Secondary | ICD-10-CM

## 2018-06-08 MED ORDER — CYCLOBENZAPRINE HCL 10 MG PO TABS: 10.0000 mg | ORAL_TABLET | Freq: Three times a day (TID) | ORAL | 0 refills | Status: DC | PRN

## 2018-06-08 MED ORDER — AMLODIPINE BESYLATE 10 MG PO TABS: 10.0000 mg | ORAL_TABLET | Freq: Every day | ORAL | 3 refills | Status: DC

## 2018-06-08 MED ORDER — AMLODIPINE BESYLATE 5 MG PO TABS: 5.0000 mg | ORAL_TABLET | Freq: Every day | ORAL | 0 refills | Status: DC

## 2018-06-08 MED ORDER — ROSUVASTATIN CALCIUM 20 MG PO TABS: 20.0000 mg | ORAL_TABLET | Freq: Every day | ORAL | 3 refills | Status: DC

## 2018-06-08 MED ORDER — TAMSULOSIN HCL 0.4 MG PO CAPS: 0.4000 mg | ORAL_CAPSULE | Freq: Every day | ORAL | 1 refills | Status: DC

## 2018-06-08 MED ORDER — GABAPENTIN 300 MG PO CAPS: 300.0000 mg | ORAL_CAPSULE | Freq: Three times a day (TID) | ORAL | 2 refills | Status: DC

## 2018-06-08 NOTE — Patient Instructions (Signed)

## 2018-06-08 NOTE — Progress Notes (Signed)
Subjective:    Patient ID: Jonathan Moses, male    DOB: 1975/10/24, 42 y.o.   MRN: [id number]  Chief Complaint  Patient presents with  . Medical Management of Chronic Issues    refills   Pt presents to the office today for chronic follow up.  Hypertension  This is a chronic problem. The current episode started more than 1 year ago. The problem has been waxing and waning since onset. Associated symptoms include malaise/fatigue and shortness of breath. Pertinent negatives include no peripheral edema. Risk factors for coronary artery disease include obesity and male gender. The current treatment provides moderate improvement. There is no history of kidney disease, CAD/MI, CVA or heart failure.  Back Pain  This is a chronic problem. The current episode started more than 1 year ago. The problem occurs intermittently. The problem has been waxing and waning since onset. The pain is present in the lumbar spine and thoracic spine. The pain is at a severity of 9/10. The pain is moderate. He has tried bed rest, NSAIDs and muscle relaxant for the symptoms. The treatment provided mild relief.  Hyperlipidemia  This is a chronic problem. The current episode started more than 1 year ago. The problem is uncontrolled. Exacerbating diseases include obesity. Associated symptoms include shortness of breath. Current antihyperlipidemic treatment includes statins. The current treatment provides moderate improvement of lipids. Risk factors for coronary artery disease include dyslipidemia, hypertension, male sex and obesity.  Benign Prostatic Hypertrophy  This is a chronic problem. The current episode started more than 1 year ago. Irritative symptoms include frequency, nocturia (5-6 times) and urgency. Obstructive symptoms include an intermittent stream and straining. Past treatments include tamsulosin.      Review of Systems  Constitutional: Positive for malaise/fatigue.  Respiratory: Positive for shortness of  breath.   Genitourinary: Positive for frequency, nocturia (5-6 times) and urgency.  Musculoskeletal: Positive for back pain.  All other systems reviewed and are negative.      Objective:   Physical Exam Vitals signs reviewed.  Constitutional:      General: He is not in acute distress.    Appearance: He is well-developed.  HENT:     Head: Normocephalic.     Right Ear: External ear normal.     Left Ear: External ear normal.  Eyes:     General:        Right eye: No discharge.        Left eye: No discharge.     Pupils: Pupils are equal, round, and reactive to light.  Neck:     Musculoskeletal: Normal range of motion and neck supple.     Thyroid: No thyromegaly.  Cardiovascular:     Rate and Rhythm: Normal rate and regular rhythm.     Heart sounds: Normal heart sounds. No murmur.  Pulmonary:     Effort: Pulmonary effort is normal. No respiratory distress.     Breath sounds: Normal breath sounds. No wheezing.  Abdominal:     General: Bowel sounds are normal. There is no distension.     Palpations: Abdomen is soft.     Tenderness: There is no abdominal tenderness.  Musculoskeletal:        General: No tenderness.     Comments: Pain in lumbar with flexion and extension  Skin:    General: Skin is warm and dry.     Findings: No erythema or rash.  Neurological:     Mental Status: He is alert and oriented to person, place,  and time.     Cranial Nerves: No cranial nerve deficit.     Deep Tendon Reflexes: Reflexes are normal and symmetric.  Psychiatric:        Behavior: Behavior normal.        Thought Content: Thought content normal.        Judgment: Judgment normal.      BP (!) 144/84   Pulse (!) 102   Temp 98.1 F (36.7 C) (Oral)   Ht '5\' 6"'  (1.676 m)   Wt 292 lb (132.5 kg)   BMI 47.13 kg/m      Assessment & Plan:  Benoit Meech comes in today with chief complaint of Medical Management of Chronic Issues (refills)   Diagnosis and orders addressed:  1. Essential  hypertension We will increase Norvasc to 10 mg from 5 mg -Daily blood pressure log given with instructions on how to fill out and told to bring to next visit -Dash diet information given -Exercise encouraged - Stress Management  -Continue current meds -RTO in 2 weeks  - CBC with Differential/Platelet - CMP14+EGFR - amLODipine (NORVASC) 10 MG tablet; Take 1 tablet (10 mg total) by mouth daily.  Dispense: 90 tablet; Refill: 3  2. Vitamin D deficiency - CBC with Differential/Platelet - CMP14+EGFR  3. Chronic bilateral low back pain without sciatica Follow up with Ortho and try to get MRI scheduled  - cyclobenzaprine (FLEXERIL) 10 MG tablet; Take 1 tablet (10 mg total) by mouth 3 (three) times daily as needed. for muscle spams  Dispense: 30 tablet; Refill: 0 - gabapentin (NEURONTIN) 300 MG capsule; Take 1 capsule (300 mg total) by mouth 3 (three) times daily.  Dispense: 270 capsule; Refill: 2 - CBC with Differential/Platelet - CMP14+EGFR  4. Morbid obesity (HCC) - CBC with Differential/Platelet - CMP14+EGFR  5. Current smoker Smoking cessation discused - CBC with Differential/Platelet - CMP14+EGFR  6. Benign prostatic hyperplasia with incomplete bladder emptying - tamsulosin (FLOMAX) 0.4 MG CAPS capsule; Take 1 capsule (0.4 mg total) by mouth daily.  Dispense: 90 capsule; Refill: 1 - CBC with Differential/Platelet - CMP14+EGFR - PSA, total and free  7. Hyperlipidemia, unspecified hyperlipidemia type - rosuvastatin (CRESTOR) 20 MG tablet; Take 1 tablet (20 mg total) by mouth at bedtime.  Dispense: 90 tablet; Refill: 3 - CBC with Differential/Platelet - CMP14+EGFR - Lipid panel  8. Annual physical exam - CBC with Differential/Platelet - CMP14+EGFR - Lipid panel - TSH - PSA, total and free   Labs pending Health Maintenance reviewed Diet and exercise encouraged  Follow up plan: 2 weeks to recheck HTN   Evelina Dun, FNP

## 2018-06-09 ENCOUNTER — Other Ambulatory Visit: Payer: Self-pay | Admitting: Family

## 2018-06-09 LAB — LIPID PANEL
Chol/HDL Ratio: 4.7 ratio (ref 0.0–5.0)
Cholesterol, Total: 168 mg/dL (ref 100–199)
HDL: 36 mg/dL — ABNORMAL LOW (ref >39)
LDL Calculated: 68 mg/dL (ref 0–99)
Triglycerides: 321 mg/dL — ABNORMAL HIGH (ref 0–149)
VLDL Cholesterol Cal: 64 mg/dL — ABNORMAL HIGH (ref 5–40)

## 2018-06-09 LAB — CMP14+EGFR
ALT: 34 IU/L (ref 0–44)
AST: 27 IU/L (ref 0–40)
Albumin/Globulin Ratio: 1.3 (ref 1.2–2.2)
Albumin: 4.1 g/dL (ref 3.5–5.5)
Alkaline Phosphatase: 94 IU/L (ref 39–117)
BUN/Creatinine Ratio: 13 (ref 9–20)
BUN: 14 mg/dL (ref 6–24)
Bilirubin Total: 0.2 mg/dL (ref 0.0–1.2)
CHLORIDE: 103 mmol/L (ref 96–106)
CO2: 23 mmol/L (ref 20–29)
Calcium: 9.3 mg/dL (ref 8.7–10.2)
Creatinine, Ser: 1.06 mg/dL (ref 0.76–1.27)
GFR calc Af Amer: 100 mL/min/{1.73_m2} (ref >59)
GFR calc non Af Amer: 87 mL/min/{1.73_m2} (ref >59)
GLOBULIN, TOTAL: 3.2 g/dL (ref 1.5–4.5)
Glucose: 99 mg/dL (ref 65–99)
Potassium: 4.1 mmol/L (ref 3.5–5.2)
Sodium: 144 mmol/L (ref 134–144)
Total Protein: 7.3 g/dL (ref 6.0–8.5)

## 2018-06-09 LAB — CBC WITH DIFFERENTIAL/PLATELET
BASOS: 1 %
Basophils Absolute: 0 10*3/uL (ref 0.0–0.2)
EOS (ABSOLUTE): 0.2 10*3/uL (ref 0.0–0.4)
Eos: 2 %
Hematocrit: 42.9 % (ref 37.5–51.0)
Hemoglobin: 14.1 g/dL (ref 13.0–17.7)
Immature Grans (Abs): 0 10*3/uL (ref 0.0–0.1)
Immature Granulocytes: 0 %
LYMPHS ABS: 2.3 10*3/uL (ref 0.7–3.1)
Lymphs: 28 %
MCH: 29.6 pg (ref 26.6–33.0)
MCHC: 32.9 g/dL (ref 31.5–35.7)
MCV: 90 fL (ref 79–97)
Monocytes Absolute: 1 10*3/uL — ABNORMAL HIGH (ref 0.1–0.9)
Monocytes: 13 %
NEUTROS ABS: 4.6 10*3/uL (ref 1.4–7.0)
Neutrophils: 56 %
Platelets: 287 10*3/uL (ref 150–450)
RBC: 4.76 x10E6/uL (ref 4.14–5.80)
RDW: 13.2 % (ref 12.3–15.4)
WBC: 8.1 10*3/uL (ref 3.4–10.8)

## 2018-06-09 LAB — PSA, TOTAL AND FREE
PSA FREE: 0.13 ng/mL
PSA, Free Pct: 26 %
Prostate Specific Ag, Serum: 0.5 ng/mL (ref 0.0–4.0)

## 2018-06-09 LAB — TSH: TSH: 1.66 u[IU]/mL (ref 0.450–4.500)

## 2018-07-13 ENCOUNTER — Other Ambulatory Visit: Payer: Self-pay | Admitting: Family

## 2018-07-13 DIAGNOSIS — G8929 Other chronic pain: Secondary | ICD-10-CM

## 2018-07-13 DIAGNOSIS — M545 Low back pain, unspecified: Secondary | ICD-10-CM

## 2018-07-14 NOTE — Telephone Encounter (Signed)
Last seen: 06/08/18

## 2018-08-25 ENCOUNTER — Emergency Department (HOSPITAL_COMMUNITY)
Admission: EM | Admit: 2018-08-25 | Discharge: 2018-08-26 | Disposition: A | Discharge status: Home / Self Care | Payer: Commercial Managed Care - PPO | Attending: Emergency Medicine | Admitting: Emergency Medicine

## 2018-08-25 ENCOUNTER — Emergency Department (HOSPITAL_COMMUNITY): Payer: Commercial Managed Care - PPO

## 2018-08-25 ENCOUNTER — Encounter (HOSPITAL_COMMUNITY): Payer: Self-pay | Admitting: Emergency Medicine

## 2018-08-25 ENCOUNTER — Other Ambulatory Visit: Payer: Self-pay

## 2018-08-25 DIAGNOSIS — I10 Essential (primary) hypertension: Secondary | ICD-10-CM | POA: Insufficient documentation

## 2018-08-25 DIAGNOSIS — M1990 Unspecified osteoarthritis, unspecified site: Secondary | ICD-10-CM | POA: Diagnosis not present

## 2018-08-25 DIAGNOSIS — R52 Pain, unspecified: Secondary | ICD-10-CM

## 2018-08-25 DIAGNOSIS — M5441 Lumbago with sciatica, right side: Secondary | ICD-10-CM | POA: Diagnosis not present

## 2018-08-25 DIAGNOSIS — F17210 Nicotine dependence, cigarettes, uncomplicated: Secondary | ICD-10-CM | POA: Diagnosis not present

## 2018-08-25 DIAGNOSIS — Z79899 Other long term (current) drug therapy: Secondary | ICD-10-CM | POA: Insufficient documentation

## 2018-08-25 DIAGNOSIS — M545 Low back pain: Secondary | ICD-10-CM | POA: Diagnosis present

## 2018-08-25 LAB — COMPREHENSIVE METABOLIC PANEL
ALK PHOS: 94 U/L (ref 38–126)
ALT: 30 U/L (ref 0–44)
AST: 22 U/L (ref 15–41)
Albumin: 3.7 g/dL (ref 3.5–5.0)
Anion gap: 8 (ref 5–15)
BUN: 15 mg/dL (ref 6–20)
CO2: 24 mmol/L (ref 22–32)
Calcium: 9 mg/dL (ref 8.9–10.3)
Chloride: 104 mmol/L (ref 98–111)
Creatinine, Ser: 0.76 mg/dL (ref 0.61–1.24)
GFR calc Af Amer: >60 mL/min (ref >60)
GFR calc non Af Amer: >60 mL/min (ref >60)
Glucose, Bld: 108 mg/dL — ABNORMAL HIGH (ref 70–99)
Potassium: 3.6 mmol/L (ref 3.5–5.1)
Sodium: 136 mmol/L (ref 135–145)
Total Bilirubin: 0.5 mg/dL (ref 0.3–1.2)
Total Protein: 7.8 g/dL (ref 6.5–8.1)

## 2018-08-25 LAB — CBC WITH DIFFERENTIAL/PLATELET
Abs Immature Granulocytes: 0.02 10*3/uL (ref 0.00–0.07)
BASOS PCT: 0 %
Basophils Absolute: 0 10*3/uL (ref 0.0–0.1)
Eosinophils Absolute: 0.2 10*3/uL (ref 0.0–0.5)
Eosinophils Relative: 3 %
HCT: 45.5 % (ref 39.0–52.0)
Hemoglobin: 13.9 g/dL (ref 13.0–17.0)
Immature Granulocytes: 0 %
Lymphocytes Relative: 29 %
Lymphs Abs: 2.4 10*3/uL (ref 0.7–4.0)
MCH: 28 pg (ref 26.0–34.0)
MCHC: 30.5 g/dL (ref 30.0–36.0)
MCV: 91.5 fL (ref 80.0–100.0)
MONO ABS: 0.9 10*3/uL (ref 0.1–1.0)
MONOS PCT: 10 %
Neutro Abs: 4.9 10*3/uL (ref 1.7–7.7)
Neutrophils Relative %: 58 %
Platelets: 255 10*3/uL (ref 150–400)
RBC: 4.97 MIL/uL (ref 4.22–5.81)
RDW: 13.5 % (ref 11.5–15.5)
WBC: 8.4 10*3/uL (ref 4.0–10.5)
nRBC: 0 % (ref 0.0–0.2)

## 2018-08-25 LAB — SEDIMENTATION RATE: Sed Rate: 25 mm/hr — ABNORMAL HIGH (ref 0–16)

## 2018-08-25 MED ORDER — HYDROCODONE-ACETAMINOPHEN 5-325 MG PO TABS
1.0000 | ORAL_TABLET | Freq: Once | ORAL | Status: AC
  Administered 2018-08-25: 1 via ORAL
  Filled 2018-08-25: qty 1

## 2018-08-25 MED ORDER — METHYLPREDNISOLONE SODIUM SUCC 125 MG IJ SOLR
125.0000 mg | Freq: Once | INTRAMUSCULAR | Status: AC
  Administered 2018-08-25: 125 mg via INTRAMUSCULAR
  Filled 2018-08-25: qty 2

## 2018-08-25 NOTE — ED Triage Notes (Signed)
Pt c/o bilateral hip pain that shoots into back that has been a chronic problem. States yesterday pain was worse than normal. Has tried heat and OTC medication with no relief. Hx of bursitis and sciatic nerve problems. Endorses difficulty ambulating.

## 2018-08-25 NOTE — ED Notes (Signed)
EDP at bedside  

## 2018-08-25 NOTE — ED Provider Notes (Signed)
[facility] EMERGENCY DEPARTMENT Provider Note   CSN: [id number] Arrival date & time: 08/25/18  1735    History   Chief Complaint Chief Complaint  Patient presents with  . Hip Pain  . Back Pain    HPI Jonathan Moses is a 43 y.o. male.     HPI Patient with history of chronic low back pain.  States he has had worsening pain over the last 2 days.  Pain is mostly in the lumbar and sacral region and radiates to the right thigh and hip.  No known trauma.  No fever or chills.  No bowel or bladder incontinence.  States he went to work today but pain worsened to the point where he was unable to ambulate.  States he takes gabapentin and Flexeril without improvement of symptoms.  Patient states last year he was referred to an orthopedist for an MRI but that the co-pay was too expensive. Past Medical History:  Diagnosis Date  . Hypertension     Patient Active Problem List   Diagnosis Date Noted  . Hypertension 12/31/2016  . Vitamin D deficiency 12/16/2016  . Urine frequency 09/11/2016  . Current smoker 09/11/2016  . Morbid obesity (HCC) 09/11/2016  . Chronic low back pain 09/11/2016    Past Surgical History:  Procedure Laterality Date  . ANKLE SURGERY Right   . TONSILLECTOMY    . TONSILLECTOMY          Home Medications    Prior to Admission medications   Medication Sig Start Date End Date Taking? Authorizing Provider  amLODipine (NORVASC) 10 MG tablet Take 1 tablet (10 mg total) by mouth daily. 06/08/18  Yes Hawks, Christy A, FNP  cyclobenzaprine (FLEXERIL) 10 MG tablet TAKE 1 TABLET BY MOUTH THREE TIMES DAILY AS NEEDED FOR MUSCLE SPASM Patient taking differently: Take 10 mg by mouth 3 (three) times daily as needed for muscle spasms.  07/14/18  Yes Hawks, Christy A, FNP  gabapentin (NEURONTIN) 300 MG capsule Take 1 capsule (300 mg total) by mouth 3 (three) times daily. 06/08/18  Yes Hawks, Christy A, FNP  Multiple Vitamin (MULTIVITAMIN WITH MINERALS) TABS tablet Take 1  tablet by mouth every morning.   Yes [provider]  rosuvastatin (CRESTOR) 20 MG tablet Take 1 tablet (20 mg total) by mouth at bedtime. 06/08/18  Yes Hawks, Christy A, FNP  tamsulosin (FLOMAX) 0.4 MG CAPS capsule Take 1 capsule (0.4 mg total) by mouth daily. 06/08/18  Yes Hawks, Edilia Bo, FNP    Family History Family History  Problem Relation Age of Onset  . Diabetes Mother   . Hypertension Mother   . Diabetes Father   . Hypertension Father     Social History Social History   Tobacco Use  . Smoking status: Current Every Day Smoker    Packs/day: 0.50  . Smokeless tobacco: Never Used  Substance Use Topics  . Alcohol use: Yes    Comment: occ   . Drug use: No     Allergies   Ibuprofen and Lisinopril   Review of Systems Review of Systems  Constitutional: Negative for chills and fever.  Respiratory: Negative for cough and shortness of breath.   Cardiovascular: Negative for chest pain.  Gastrointestinal: Negative for abdominal pain, diarrhea, nausea and vomiting.  Genitourinary: Negative for difficulty urinating, flank pain, frequency and hematuria.  Musculoskeletal: Positive for arthralgias, back pain, gait problem and myalgias. Negative for joint swelling and neck pain.  Skin: Negative for rash and wound.  Neurological: Positive for  weakness. Negative for dizziness, light-headedness, numbness and headaches.  All other systems reviewed and are negative.    Physical Exam Updated Vital Signs BP 135/82   Pulse 90   Temp 98.1 F (36.7 C) (Oral)   Resp 18   Ht  (1.727 m)   Wt 127 kg   SpO2 92%   BMI 42.57 kg/m   Physical Exam Vitals signs and nursing note reviewed.  Constitutional:      Appearance: Normal appearance. He is well-developed.  HENT:     Head: Normocephalic and atraumatic.     Nose: Nose normal. No congestion or rhinorrhea.     Mouth/Throat:     Mouth: Mucous membranes are moist.     Pharynx: No oropharyngeal exudate or posterior  oropharyngeal erythema.  Eyes:     Extraocular Movements: Extraocular movements intact.     Pupils: Pupils are equal, round, and reactive to light.  Neck:     Musculoskeletal: Normal range of motion and neck supple. No neck rigidity or muscular tenderness.  Cardiovascular:     Rate and Rhythm: Regular rhythm. Tachycardia present.     Heart sounds: No murmur. No friction rub. No gallop.   Pulmonary:     Effort: Pulmonary effort is normal. No respiratory distress.     Breath sounds: Normal breath sounds. No stridor. No wheezing, rhonchi or rales.  Chest:     Chest wall: No tenderness.  Abdominal:     General: Bowel sounds are normal.     Palpations: Abdomen is soft.     Tenderness: There is no abdominal tenderness. There is no right CVA tenderness, left CVA tenderness, guarding or rebound.  Musculoskeletal: Normal range of motion.        General: Tenderness present. No swelling, deformity or signs of injury.     Right lower leg: No edema.     Left lower leg: No edema.     Comments: Patient with inferior midline lumbar and right-sided SI joint tenderness to palpation.  He has pain with external rotation of the right leg.  Negative straight leg raise.  Tenderness to palpation over the right lateral hip and lateral thigh.  Full range of motion of the right knee without deformity, erythema or warmth.  No lower extremity swelling or asymmetry.  2+ dorsalis pedis and posterior tibial pulses bilaterally.  Lymphadenopathy:     Cervical: No cervical adenopathy.  Skin:    General: Skin is warm and dry.     Findings: No erythema or rash.  Neurological:     Mental Status: He is alert and oriented to person, place, and time.     Comments: Patient has mild weakness with dorsal and plantar flexion of the right foot.  Sensation is fully intact including no saddle anesthesia.  Patient has some weakness of flexion of the right hip.  Unsure whether this is due to pain.  Psychiatric:        Mood and  Affect: Mood normal.        Behavior: Behavior normal.      ED Treatments / Results  Labs (all labs ordered are listed, but only abnormal results are displayed) Labs Reviewed  COMPREHENSIVE METABOLIC PANEL - Abnormal; Notable for the following components:      Result Value   Glucose, Bld 108 (*)    All other components within normal limits  SEDIMENTATION RATE - Abnormal; Notable for the following components:   Sed Rate 25 (*)    All other components  within normal limits  CBC WITH DIFFERENTIAL/PLATELET    EKG None  Radiology Dg Lumbar Spine Complete  Result Date: 08/25/2018 CLINICAL DATA:  Lumbosacral back pain radiating to the hips. Acute on chronic pain. EXAM: LUMBAR SPINE - COMPLETE 4+ VIEW COMPARISON:  None. FINDINGS: The alignment is maintained. Vertebral body heights are normal. There is no listhesis. The posterior elements are intact. Mild endplate spurring at T11-T12 and T12-L1, as well as superior endplate of L5. Minimal L1-L2 disc space narrowing, remaining disc spaces are preserved. No fracture. Sacroiliac joints are congruent with mild degenerative change. IMPRESSION: 1. No acute osseous abnormality. 2. Mild spondylosis of the lumbar spine and degenerative change of the sacroiliac joints. Electronically Signed   By: Narda Rutherford M.D.   On: 08/25/2018 19:07   Dg Hips Bilat With Pelvis 3-4 Views  Result Date: 08/25/2018 CLINICAL DATA:  Lumbosacral back pain radiating to the hips. Acute on chronic pain. EXAM: DG HIP (WITH OR WITHOUT PELVIS) 3-4V BILAT COMPARISON:  None. FINDINGS: The cortical margins of the bony pelvis both hips are intact. No fracture. Pubic symphysis and sacroiliac joints are congruent. Mild degenerative change of both sacroiliac joints. Both femoral heads are well-seated in the respective acetabula. Bilateral hip joint spaces are preserved. IMPRESSION: 1. No acute osseous abnormality. 2. Mild degenerative change of the sacroiliac joints. Electronically  Signed   By: Narda Rutherford M.D.   On: 08/25/2018 19:09    Procedures Procedures (including critical care time)  Medications Ordered in ED Medications  methylPREDNISolone sodium succinate (SOLU-MEDROL) 125 mg/2 mL injection 125 mg (125 mg Intramuscular Given 08/25/18 2005)  HYDROcodone-acetaminophen (NORCO/VICODIN) 5-325 MG per tablet 1 tablet (1 tablet Oral Given 08/25/18 2005)     Initial Impression / Assessment and Plan / ED Course  I have reviewed the triage vital signs and the nursing notes.  Pertinent labs & imaging results that were available during my care of the patient were reviewed by me and considered in my medical decision making (see chart for details).       Patient initially tachycardic on arrival.  Does appear to have some weakness in his plantarflexion and dorsiflexion of the right foot.  Mild elevation in sed rate.  Patient is not immunocompromised and no history of IV drug abuse.  However given rapid progression of his neurologic symptoms and elevation of his sed rate will transfer to [facility] for MRI of his lumbar spine.  If this is negative for concerning findings patient can follow-up as an outpatient with neurosurgery.  Discussed with Dr. Rhunette Croft who is expecting patient.   Final Clinical Impressions(s) / ED Diagnoses   Final diagnoses:  Acute right-sided low back pain with right-sided sciatica    ED Discharge Orders    None       Loren Racer, MD 08/25/18 2207

## 2018-08-25 NOTE — ED Notes (Signed)
Pt transferred fro [facility] via [facility], c/o lower back pain that radiates to the right leg. Rates pain 10/10.

## 2018-08-25 NOTE — ED Provider Notes (Signed)
Patient is a 43 year old male with history of chronic low back issues.  He was sent here from [facility] for an MRI.  This patient has been having back and hip pain for years, however it has significantly worsened recently.  He states he was at work today and was having difficulty ambulating secondary to the pain.  He denies any numbness or tingling of the extremities.  He denies any bowel or bladder complaints.  Upon arrival, the patient's neurologic exam is unremarkable.  Sensation is intact to both legs and strength is 5 out of 5 in both lower extremities.  He does have some pain with range of motion in the hips, particularly the right.  Patient went for his MRI.  This shows inflammatory change within the marrow of several of the lumbar bone marrow signal at T12, L3, L5, the left sacrum with differential diagnoses of osteomyelitis, inflammatory arthropathy, or less likely metastasis.  Patient has no fever and no white count and I highly doubt osteomyelitis.  He has no history of IV drug abuse.  I also feel as though metastasis is less likely as the patient has no other symptoms.  This will be treated as an inflammatory arthropathy with steroids and follow-up with his orthopedist.   Geoffery Lyons, MD 08/26/18 [phone]

## 2018-08-26 ENCOUNTER — Emergency Department (HOSPITAL_COMMUNITY): Payer: Commercial Managed Care - PPO

## 2018-08-26 MED ORDER — OXYCODONE-ACETAMINOPHEN 5-325 MG PO TABS
2.0000 | ORAL_TABLET | Freq: Once | ORAL | Status: AC
  Administered 2018-08-26: 2 via ORAL
  Filled 2018-08-26: qty 2

## 2018-08-26 MED ORDER — PREDNISONE 10 MG PO TABS: ORAL_TABLET | ORAL | 0 refills | Status: DC

## 2018-08-26 MED ORDER — OXYCODONE-ACETAMINOPHEN 5-325 MG PO TABS: 1.0000 | ORAL_TABLET | Freq: Four times a day (QID) | ORAL | 0 refills | Status: DC | PRN

## 2018-08-26 NOTE — ED Notes (Signed)
Patient verbalizes understanding of discharge instructions. Opportunity for questioning and answers were provided. Armband removed by staff, pt discharged from ED in wheelchair with significant other.

## 2018-08-26 NOTE — Discharge Instructions (Addendum)
Prednisone as prescribed.  Percocet as prescribed as needed for pain.  Follow-up with your orthopedist to discuss the results of your MRI.

## 2018-08-26 NOTE — ED Notes (Signed)
Patient transported to MRI 

## 2018-10-26 ENCOUNTER — Telehealth: Payer: Self-pay | Admitting: Family

## 2018-10-31 NOTE — Telephone Encounter (Signed)
LM for pt to call back and schedule CPE with [facility]

## 2019-06-05 ENCOUNTER — Other Ambulatory Visit: Payer: Self-pay

## 2019-06-05 DIAGNOSIS — Z20822 Contact with and (suspected) exposure to covid-19: Secondary | ICD-10-CM

## 2019-06-06 LAB — NOVEL CORONAVIRUS, NAA: SARS-CoV-2, NAA: NOT DETECTED

## 2019-08-31 ENCOUNTER — Other Ambulatory Visit: Payer: Self-pay | Admitting: Family

## 2019-08-31 DIAGNOSIS — G8929 Other chronic pain: Secondary | ICD-10-CM

## 2019-11-16 ENCOUNTER — Other Ambulatory Visit: Payer: Self-pay | Admitting: Family

## 2019-11-16 DIAGNOSIS — E785 Hyperlipidemia, unspecified: Secondary | ICD-10-CM

## 2019-12-26 ENCOUNTER — Other Ambulatory Visit: Payer: Self-pay | Admitting: Family

## 2019-12-26 DIAGNOSIS — E785 Hyperlipidemia, unspecified: Secondary | ICD-10-CM

## 2019-12-26 NOTE — Telephone Encounter (Signed)
Hawks. NTBS LOV 06/08/18

## 2020-04-19 IMAGING — DX DG LUMBAR SPINE COMPLETE 4+V
5 series · 5 of 5 positions shown · non-contrast
Comparison: None.

CLINICAL DATA: Lumbosacral back pain radiating to the hips. Acute
on chronic pain.

EXAM:
LUMBAR SPINE - COMPLETE 4+ VIEW

[l-spine ap]
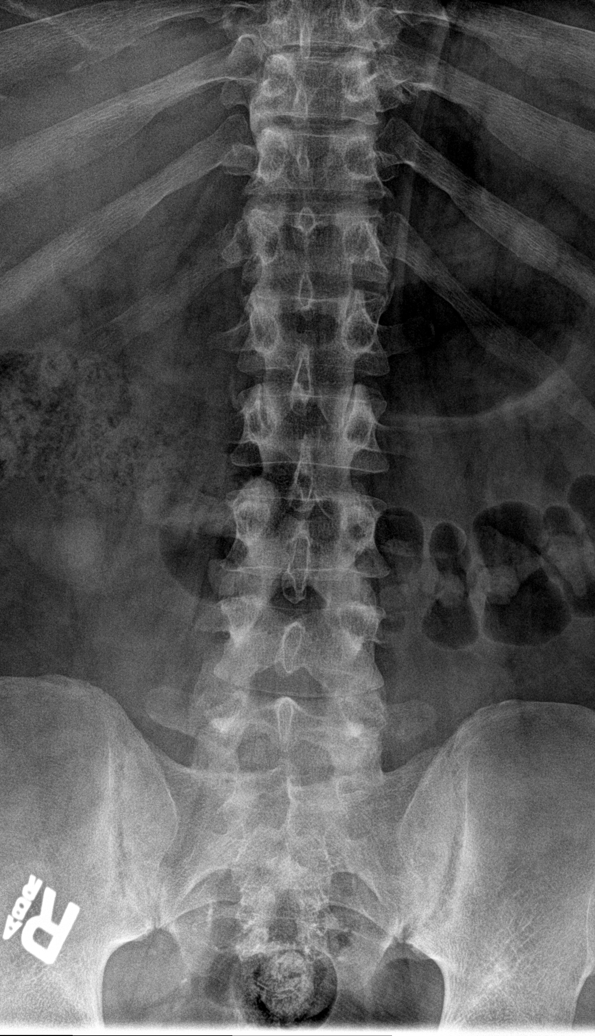

[l-spine obl (1 of 2)]
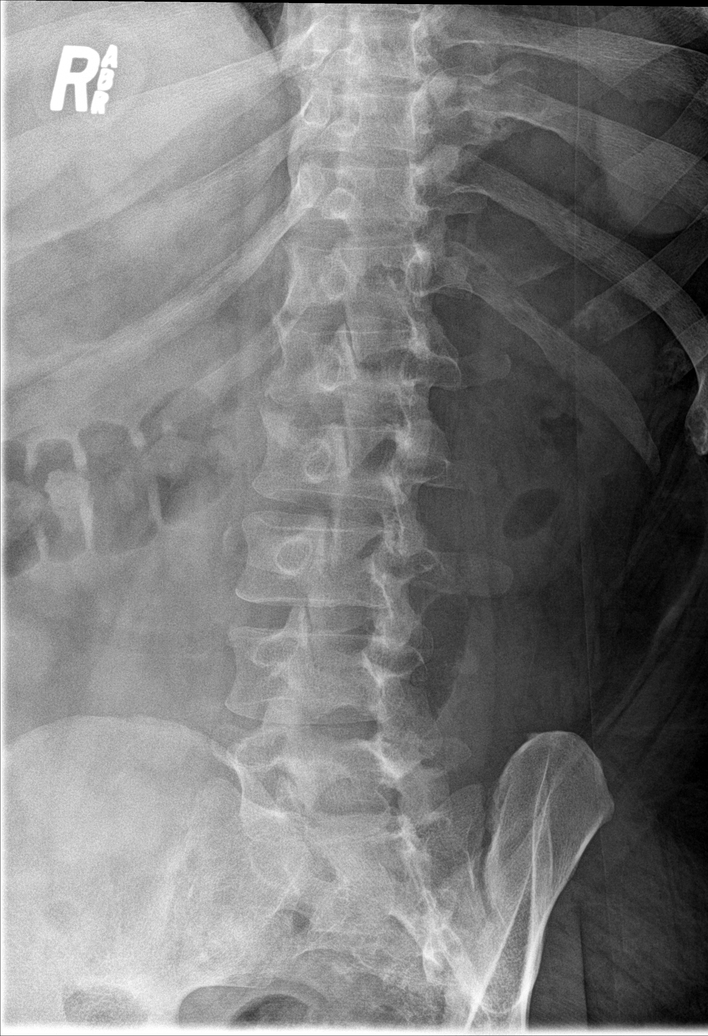

[l-spine obl (2 of 2)]
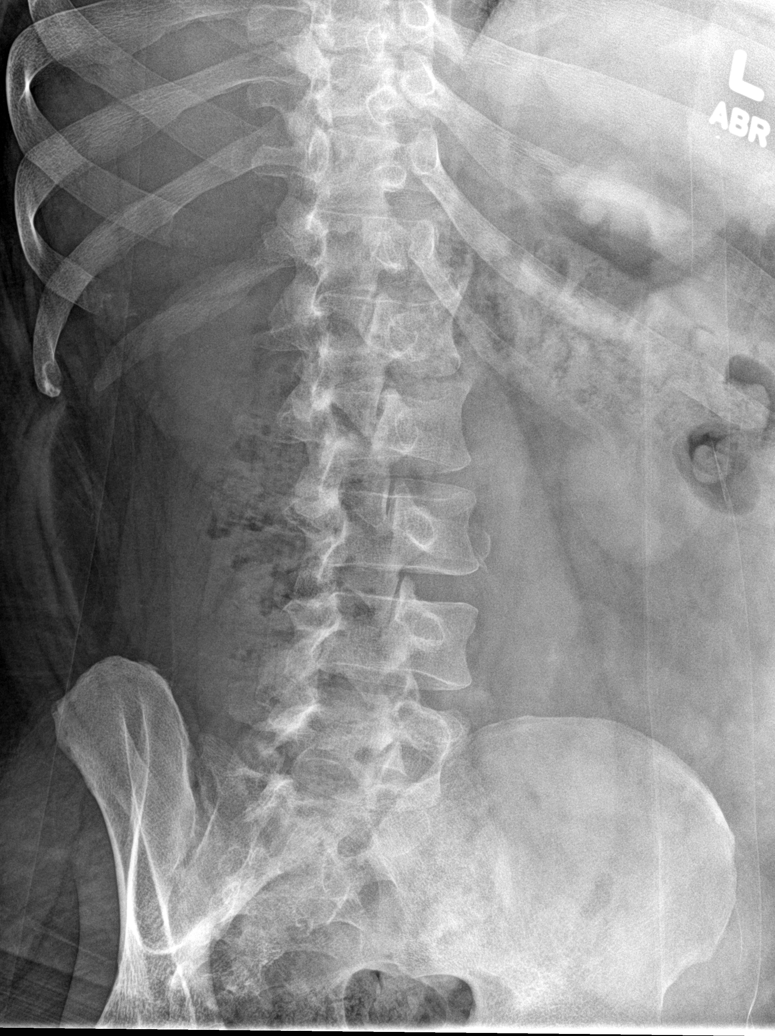

[l-spine lat]
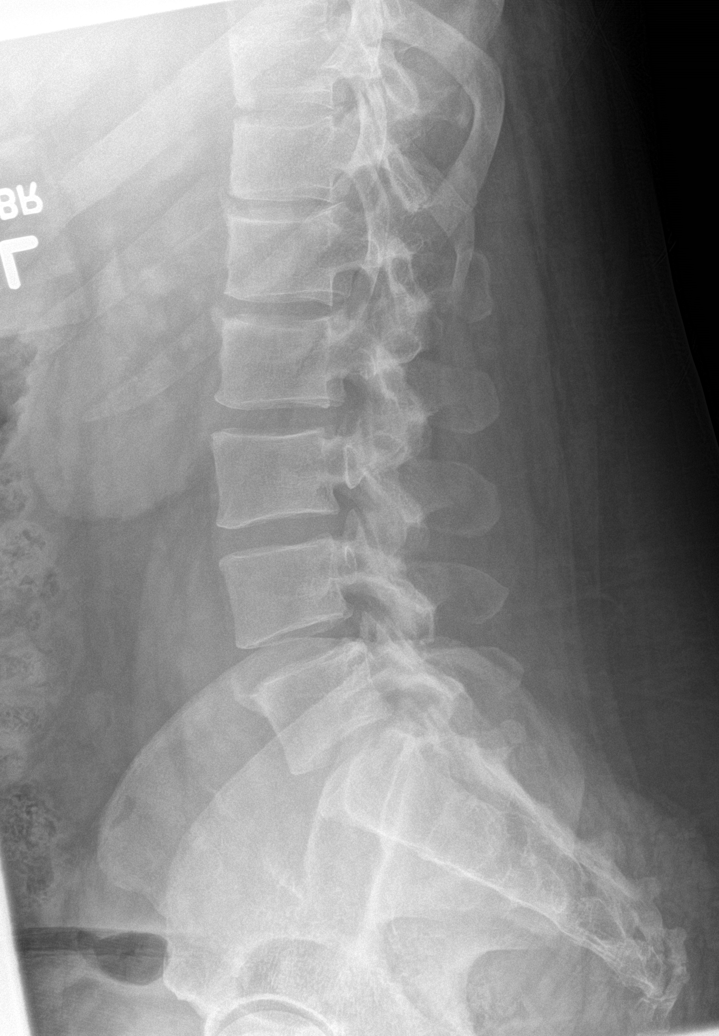

[l-spine spot]
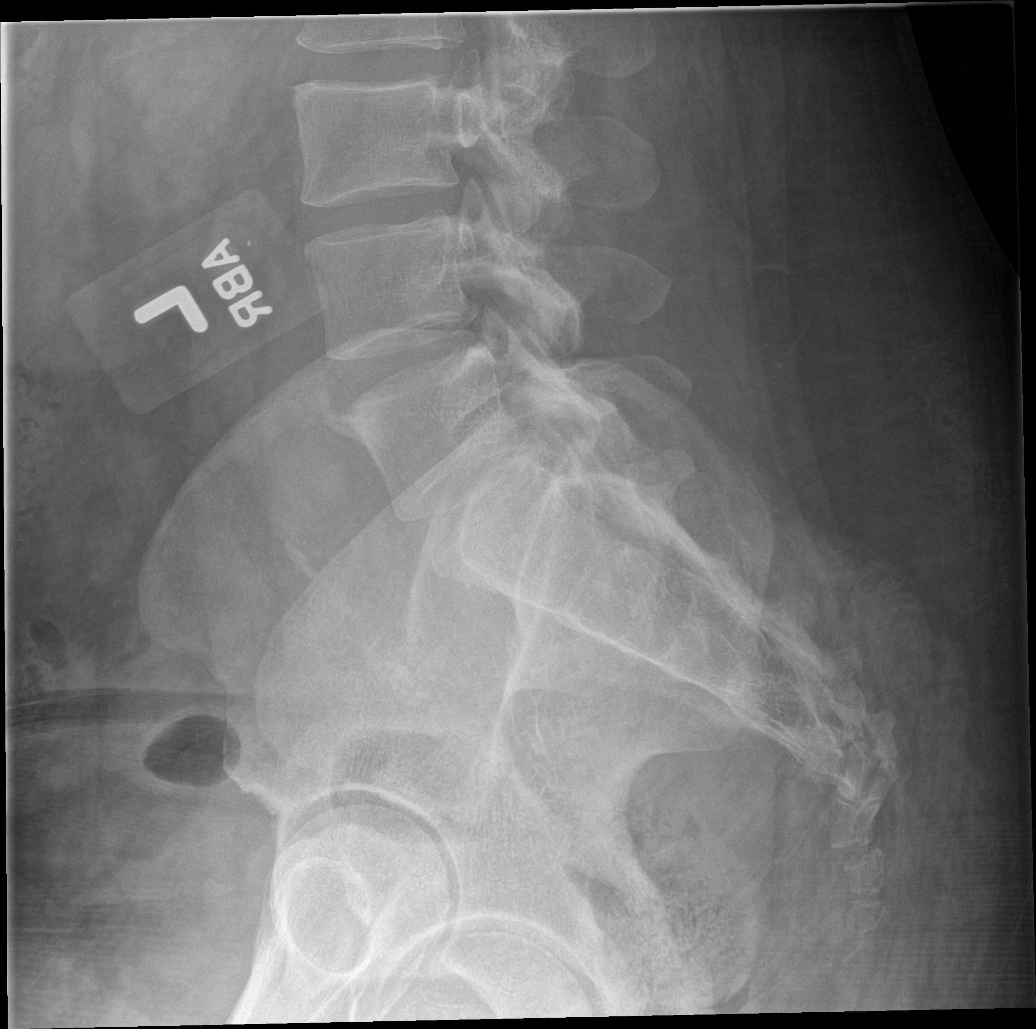

[5 of 5 positions shown; findings below may reference images not displayed]

FINDINGS: The alignment is maintained. Vertebral body heights are normal.
There is no listhesis. The posterior elements are intact. Mild
endplate spurring at T11-T12 and T12-L1, as well as superior
endplate of L5. Minimal L1-L2 disc space narrowing, remaining disc
spaces are preserved. No fracture. Sacroiliac joints are congruent
with mild degenerative change.
IMPRESSION: 1. No acute osseous abnormality.
2. Mild spondylosis of the lumbar spine and degenerative change of
the sacroiliac joints.
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
## 2023-08-30 ENCOUNTER — Ambulatory Visit: Payer: Commercial Managed Care - PPO | Admitting: Nurse Practitioner

## 2023-08-30 ENCOUNTER — Ambulatory Visit: Admitting: Nurse Practitioner

## 2023-08-30 ENCOUNTER — Encounter: Payer: Self-pay | Admitting: Nurse Practitioner

## 2023-08-30 VITALS — BP 155/96 | HR 84 | Temp 98.2°F | Ht 68.0 in | Wt 270.6 lb

## 2023-08-30 DIAGNOSIS — F17200 Nicotine dependence, unspecified, uncomplicated: Secondary | ICD-10-CM

## 2023-08-30 DIAGNOSIS — Z800 Family history of malignant neoplasm of digestive organs: Secondary | ICD-10-CM

## 2023-08-30 DIAGNOSIS — I10 Essential (primary) hypertension: Secondary | ICD-10-CM | POA: Diagnosis not present

## 2023-08-30 DIAGNOSIS — R748 Abnormal levels of other serum enzymes: Secondary | ICD-10-CM

## 2023-08-30 DIAGNOSIS — F1010 Alcohol abuse, uncomplicated: Secondary | ICD-10-CM

## 2023-08-30 DIAGNOSIS — Z1211 Encounter for screening for malignant neoplasm of colon: Secondary | ICD-10-CM | POA: Diagnosis not present

## 2023-08-30 DIAGNOSIS — Z833 Family history of diabetes mellitus: Secondary | ICD-10-CM

## 2023-08-30 DIAGNOSIS — Z0001 Encounter for general adult medical examination with abnormal findings: Secondary | ICD-10-CM

## 2023-08-30 DIAGNOSIS — E559 Vitamin D deficiency, unspecified: Secondary | ICD-10-CM

## 2023-08-30 DIAGNOSIS — M5441 Lumbago with sciatica, right side: Secondary | ICD-10-CM

## 2023-08-30 DIAGNOSIS — M5442 Lumbago with sciatica, left side: Secondary | ICD-10-CM

## 2023-08-30 DIAGNOSIS — R631 Polydipsia: Secondary | ICD-10-CM

## 2023-08-30 DIAGNOSIS — Z125 Encounter for screening for malignant neoplasm of prostate: Secondary | ICD-10-CM

## 2023-08-30 DIAGNOSIS — G8929 Other chronic pain: Secondary | ICD-10-CM

## 2023-08-30 LAB — BAYER DCA HB A1C WAIVED: HB A1C (BAYER DCA - WAIVED): 5.2 % (ref 4.8–5.6)

## 2023-08-30 MED ORDER — VALSARTAN-HYDROCHLOROTHIAZIDE 160-12.5 MG PO TABS
1.0000 | ORAL_TABLET | Freq: Every day | ORAL | 0 refills | Status: DC
Start: 2023-08-30 — End: 2023-11-19

## 2023-08-31 ENCOUNTER — Other Ambulatory Visit: Payer: Self-pay | Admitting: Nurse Practitioner

## 2023-08-31 ENCOUNTER — Other Ambulatory Visit: Payer: Self-pay

## 2023-08-31 DIAGNOSIS — F419 Anxiety disorder, unspecified: Secondary | ICD-10-CM

## 2023-08-31 LAB — THYROID PANEL WITH TSH
Free Thyroxine Index: 2.1 (ref 1.2–4.9)
T3 Uptake Ratio: 26 % (ref 24–39)
T4, Total: 7.9 ug/dL (ref 4.5–12.0)
TSH: 2.12 u[IU]/mL (ref 0.450–4.500)

## 2023-08-31 LAB — CBC WITH DIFFERENTIAL/PLATELET
Basophils Absolute: 0 10*3/uL (ref 0.0–0.2)
Basos: 1 %
EOS (ABSOLUTE): 0.1 10*3/uL (ref 0.0–0.4)
Eos: 2 %
Hematocrit: 45.7 % (ref 37.5–51.0)
Hemoglobin: 15.4 g/dL (ref 13.0–17.7)
Immature Grans (Abs): 0 10*3/uL (ref 0.0–0.1)
Immature Granulocytes: 0 %
Lymphocytes Absolute: 1.4 10*3/uL (ref 0.7–3.1)
Lymphs: 25 %
MCH: 31.7 pg (ref 26.6–33.0)
MCHC: 33.7 g/dL (ref 31.5–35.7)
MCV: 94 fL (ref 79–97)
Monocytes Absolute: 0.6 10*3/uL (ref 0.1–0.9)
Monocytes: 11 %
Neutrophils Absolute: 3.3 10*3/uL (ref 1.4–7.0)
Neutrophils: 61 %
Platelets: 242 10*3/uL (ref 150–450)
RBC: 4.86 x10E6/uL (ref 4.14–5.80)
RDW: 12.7 % (ref 11.6–15.4)
WBC: 5.4 10*3/uL (ref 3.4–10.8)

## 2023-08-31 LAB — CMP14+EGFR
ALT: 96 IU/L — ABNORMAL HIGH (ref 0–44)
AST: 96 IU/L — ABNORMAL HIGH (ref 0–40)
Albumin: 4.3 g/dL (ref 4.1–5.1)
Alkaline Phosphatase: 107 IU/L (ref 44–121)
BUN/Creatinine Ratio: 8 — ABNORMAL LOW (ref 9–20)
BUN: 7 mg/dL (ref 6–24)
Bilirubin Total: 1.1 mg/dL (ref 0.0–1.2)
CO2: 24 mmol/L (ref 20–29)
Calcium: 9.9 mg/dL (ref 8.7–10.2)
Chloride: 99 mmol/L (ref 96–106)
Creatinine, Ser: 0.83 mg/dL (ref 0.76–1.27)
Globulin, Total: 3.8 g/dL (ref 1.5–4.5)
Glucose: 108 mg/dL — ABNORMAL HIGH (ref 70–99)
Potassium: 3.7 mmol/L (ref 3.5–5.2)
Sodium: 137 mmol/L (ref 134–144)
Total Protein: 8.1 g/dL (ref 6.0–8.5)
eGFR: 109 mL/min/{1.73_m2} (ref >59)

## 2023-08-31 LAB — VITAMIN D 25 HYDROXY (VIT D DEFICIENCY, FRACTURES): Vit D, 25-Hydroxy: 27.6 ng/mL — ABNORMAL LOW (ref 30.0–100.0)

## 2023-08-31 LAB — PSA, TOTAL AND FREE
PSA, Free Pct: 22 %
PSA, Free: 0.11 ng/mL
Prostate Specific Ag, Serum: 0.5 ng/mL (ref 0.0–4.0)

## 2023-08-31 LAB — LIPID PANEL
Chol/HDL Ratio: 3.9 ratio (ref 0.0–5.0)
Cholesterol, Total: 205 mg/dL — ABNORMAL HIGH (ref 100–199)
HDL: 53 mg/dL (ref >39)
LDL Chol Calc (NIH): 114 mg/dL — ABNORMAL HIGH (ref 0–99)
Triglycerides: 222 mg/dL — ABNORMAL HIGH (ref 0–149)
VLDL Cholesterol Cal: 38 mg/dL (ref 5–40)

## 2023-08-31 MED ORDER — TRELEGY ELLIPTA 100-62.5-25 MCG/ACT IN AEPB: 1.0000 | INHALATION_SPRAY | Freq: Every day | RESPIRATORY_TRACT | 2 refills | Status: DC

## 2023-09-01 ENCOUNTER — Other Ambulatory Visit: Payer: Self-pay

## 2023-09-01 MED ORDER — FENOFIBRATE 48 MG PO TABS: 48.0000 mg | ORAL_TABLET | Freq: Every day | ORAL | 0 refills | Status: DC

## 2023-09-01 NOTE — Addendum Note (Signed)
 Addended by: Daisy Blossom on: 09/01/2023 02:31 PM   Modules accepted: Orders

## 2023-09-03 LAB — HEPATIC FUNCTION PANEL
ALT: 91 IU/L — ABNORMAL HIGH (ref 0–44)
AST: 94 IU/L — ABNORMAL HIGH (ref 0–40)
Albumin: 4.4 g/dL (ref 4.1–5.1)
Alkaline Phosphatase: 107 IU/L (ref 44–121)
Bilirubin Total: 0.7 mg/dL (ref 0.0–1.2)
Bilirubin, Direct: 0.33 mg/dL (ref 0.00–0.40)
Total Protein: 8.1 g/dL (ref 6.0–8.5)

## 2023-09-03 LAB — SPECIMEN STATUS REPORT

## 2023-09-07 ENCOUNTER — Telehealth: Payer: Self-pay | Admitting: Family

## 2023-09-07 NOTE — Telephone Encounter (Unsigned)
 Copied from CRM [id number]. Topic: Clinical - Prescription Issue >> Sep 07, 2023  3:49 PM Antwanette L wrote: Reason for CRM: The patient wife(Carry) is calling to see Jannifer Rodney can prescribe an alternative medication for Fluticasone-Umeclidin-Vilant (TRELEGY ELLIPTA) 100-62.5-25 MCG/ACT AEPB. The patient is currently out of the medicine and the insurance company will not cover the cost. The patient can be contacted by phone at [phone] and Carry at [phone].  Preferred Pharmacy [facility] [place], [place] - [place] [facility] [address] Phone: [phone] Fax: [phone]

## 2023-09-08 ENCOUNTER — Telehealth: Payer: Self-pay

## 2023-09-08 ENCOUNTER — Other Ambulatory Visit (HOSPITAL_COMMUNITY): Payer: Self-pay

## 2023-09-08 MED ORDER — FLUTICASONE-SALMETEROL 45-21 MCG/ACT IN AERO: 2.0000 | INHALATION_SPRAY | Freq: Two times a day (BID) | RESPIRATORY_TRACT | 12 refills | Status: DC

## 2023-09-08 NOTE — Telephone Encounter (Signed)
 Pharmacy Patient Advocate Encounter   Received notification from Onbase that prior authorization for Trelegy Ellipta is required/requested.   Insurance verification completed.   The patient is insured through [facility] .   Per test claim:  Advair Diskus, Advair HFA, Dulera, and Symbicort  is preferred by the insurance.  If suggested medication is appropriate, Please send in a new RX and discontinue this one. If not, please advise as to why it's not appropriate so that we may request a Prior Authorization. Please note, some preferred medications may still require a PA.  If the suggested medications have not been trialed and there are no contraindications to their use, the PA will not be submitted, as it will not be approved.

## 2023-09-08 NOTE — Telephone Encounter (Signed)
 Pt aware and verbalized understanding. New rx sent

## 2023-09-08 NOTE — Telephone Encounter (Signed)
 Pt has re-established care with Jonathan Moses, and was last seen by her on 08/30/2023. Does he still need another appt for this?

## 2023-09-08 NOTE — Telephone Encounter (Signed)
 Please schedule patient an appointment have not seen hawks in over 2 years

## 2023-09-08 NOTE — Addendum Note (Signed)
 Addended by: Daisy Blossom on: 09/08/2023 04:25 PM   Modules accepted: Orders

## 2023-09-11 LAB — COLOGUARD: COLOGUARD: NEGATIVE

## 2023-09-14 ENCOUNTER — Other Ambulatory Visit: Payer: Self-pay | Admitting: Nurse Practitioner

## 2023-09-14 ENCOUNTER — Ambulatory Visit (HOSPITAL_COMMUNITY)
Admission: RE | Admit: 2023-09-14 | Discharge: 2023-09-14 | Disposition: A | Discharge status: Home / Self Care | Source: Ambulatory Visit | Attending: Nurse Practitioner | Admitting: Nurse Practitioner

## 2023-09-14 DIAGNOSIS — R748 Abnormal levels of other serum enzymes: Secondary | ICD-10-CM | POA: Insufficient documentation

## 2023-09-14 DIAGNOSIS — F1010 Alcohol abuse, uncomplicated: Secondary | ICD-10-CM

## 2023-09-14 DIAGNOSIS — K760 Fatty (change of) liver, not elsewhere classified: Secondary | ICD-10-CM

## 2023-09-14 NOTE — Progress Notes (Signed)
 Pt r/c.

## 2023-10-01 ENCOUNTER — Encounter (INDEPENDENT_AMBULATORY_CARE_PROVIDER_SITE_OTHER): Payer: Self-pay | Admitting: *Deleted

## 2023-10-05 ENCOUNTER — Encounter: Payer: Self-pay | Admitting: *Deleted

## 2023-11-09 ENCOUNTER — Other Ambulatory Visit: Payer: Self-pay | Admitting: *Deleted

## 2023-11-09 ENCOUNTER — Other Ambulatory Visit: Payer: Self-pay | Admitting: Nurse Practitioner

## 2023-11-09 DIAGNOSIS — I10 Essential (primary) hypertension: Secondary | ICD-10-CM

## 2023-11-09 MED ORDER — AMLODIPINE BESYLATE 10 MG PO TABS: 10.0000 mg | ORAL_TABLET | Freq: Every day | ORAL | 0 refills | Status: AC

## 2023-11-09 MED ORDER — FLUTICASONE-SALMETEROL 45-21 MCG/ACT IN AERO: 2.0000 | INHALATION_SPRAY | Freq: Two times a day (BID) | RESPIRATORY_TRACT | 2 refills | Status: AC

## 2023-11-09 NOTE — Telephone Encounter (Signed)
 Copied from CRM [id number]. Topic: Clinical - Medication Refill >> Nov 09, 2023  1:21 PM Fonda T wrote: Medication: amLODipine  (NORVASC ) 10 MG tablet  Has the patient contacted their pharmacy? Yes Per pharmacy has faxed over request for refill to the office (Agent: If no, request that the patient contact the pharmacy for the refill. If patient does not wish to contact the pharmacy document the reason why and proceed with request.) (Agent: If yes, when and what did the pharmacy advise?)  This is the patient's preferred pharmacy:  [id number] - [place], [place] - [address] [place] [address] Phone: [phone] Fax: [phone]  Is this the correct pharmacy for this prescription? Yes If no, delete pharmacy and type the correct one.   Has the prescription been filled recently? Yes  Is the patient out of the medication? No Patient has only 2 pills left   Has the patient been seen for an appointment in the last year OR does the patient have an upcoming appointment? Yes  Can we respond through MyChart? Yes  Agent: Please be advised that Rx refills may take up to 3 business days. We ask that you follow-up with your pharmacy.

## 2023-11-10 ENCOUNTER — Other Ambulatory Visit (HOSPITAL_COMMUNITY): Payer: Self-pay

## 2023-11-10 ENCOUNTER — Telehealth: Payer: Self-pay

## 2023-11-10 NOTE — Telephone Encounter (Signed)
 Pharmacy Patient Advocate Encounter  Received notification from [facility] that Prior Authorization for Fluticasone -Salmeterol 45-21MCG/ACT aerosol  has been APPROVED from Unable to obtain price due to refill too soon rejection, last fill date 11/09/2023 NExt available fill date 12/02/2023

## 2023-11-19 ENCOUNTER — Other Ambulatory Visit: Payer: Self-pay | Admitting: Nurse Practitioner

## 2023-11-19 DIAGNOSIS — I10 Essential (primary) hypertension: Secondary | ICD-10-CM

## 2023-11-25 ENCOUNTER — Other Ambulatory Visit: Payer: Self-pay | Admitting: *Deleted

## 2023-11-25 DIAGNOSIS — I10 Essential (primary) hypertension: Secondary | ICD-10-CM

## 2023-12-02 ENCOUNTER — Ambulatory Visit: Admitting: Family

## 2023-12-02 ENCOUNTER — Ambulatory Visit: Admitting: Nurse Practitioner

## 2023-12-08 ENCOUNTER — Ambulatory Visit: Admitting: Nurse Practitioner

## 2024-02-16 ENCOUNTER — Other Ambulatory Visit: Payer: Self-pay | Admitting: Nurse Practitioner

## 2024-02-16 DIAGNOSIS — I10 Essential (primary) hypertension: Secondary | ICD-10-CM

## 2024-02-29 ENCOUNTER — Other Ambulatory Visit: Payer: Self-pay | Admitting: Nurse Practitioner

## 2024-04-03 ENCOUNTER — Other Ambulatory Visit: Payer: Self-pay | Admitting: Nurse Practitioner

## 2024-04-04 NOTE — Telephone Encounter (Signed)
 Appt scheduled for 04-10-2024

## 2024-04-04 NOTE — Telephone Encounter (Signed)
 Nena pt NTBS 30-d given 02/29/24

## 2024-04-06 NOTE — Progress Notes (Signed)
 Subjective:  Patient ID: Jonathan Moses, male    DOB: 03/28/1976, 48 y.o.   MRN: [id number]  Patient Care Team: Deitra Morton Sebastian Nena, NP as PCP - General (Nurse Practitioner)   Chief Complaint:  Medical Management of Chronic Issues and Medication Refill   HPI: Jonathan Moses is a 48 y.o. male presenting on 04/10/2024 for Medical Management of Chronic Issues and Medication Refill   Discussed the use of AI scribe software for clinical note transcription with the patient, who gave verbal consent to proceed.  History of Present Illness Jonathan Moses is a 48 year old male with hypertension and COPD who presents for follow-up.  He was last seen on August 30, 2023. His current medications include Diovan , amlodipine , and an albuterol inhaler. He quit smoking about a year ago, having previously smoked a pack every two to three days.  His blood pressure today is 140/82 mmHg. His last recorded blood pressure was 155/96 mmHg on August 30, 2023. He does not consume a lot of salt but seasons his food. He is currently taking amlodipine  10 mg and Diovan  160/12.5 mg.  He was diagnosed with COPD approximately two years ago and uses an albuterol inhaler as needed. He confirms that he has an adequate supply of inhalers.  He has a history of vitamin D  deficiency, with the last lab work done in March 2025. He takes a multivitamin.  He experiences back pain. 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  He consumes alcohol daily, typically a couple of shots at night, and began drinking around the age of 89 or 89. He is not currently in counseling or therapy.    Flowsheet Row Office Visit from 04/10/2024 in [facility]  PHQ-9 Total Score 0       04/10/2024    2:32 PM 08/30/2023   10:22 AM  GAD 7 : Generalized Anxiety Score  Nervous,  Anxious, on Edge 0 2  Control/stop worrying 0 1  Worry too much - different things 0 2  Trouble relaxing 0 0  Restless 0 1  Easily annoyed or irritable 0 1  Afraid - awful might happen 0 2  Total GAD 7 Score 0 9  Anxiety Difficulty Not difficult at all Somewhat difficult      Relevant past medical, surgical, family, and social history reviewed and updated as indicated.  Allergies and medications reviewed and updated. Data reviewed: Chart in Epic.   Past Medical History:  Diagnosis Date   Anxiety    Hypertension     Past Surgical History:  Procedure Laterality Date   ANKLE SURGERY Right    TONSILLECTOMY     TONSILLECTOMY      Social History   Socioeconomic History   Marital status: Married    Spouse name: Not on file   Number of children: Not on file   Years of education: Not on file   Highest education level: Not on file  Occupational History   Not on file  Tobacco Use   Smoking status: Former    Types: Cigarettes   Smokeless tobacco: Never  Vaping Use   Vaping status: Every Day  Substance and Sexual Activity   Alcohol use: Yes    Alcohol/week: 6.0 standard drinks of alcohol    Types: 6 Shots of liquor per week   Drug use: Yes  Types: Marijuana   Sexual activity: Yes  Other Topics Concern   Not on file  Social History Narrative   Not on file   Social Drivers of Health   Financial Resource Strain: Not on file  Food Insecurity: Unknown (08/30/2023)   Hunger Vital Sign    Worried About Running Out of Food in the Last Year: Never true    Ran Out of Food in the Last Year: Not on file  Transportation Needs: No Transportation Needs (08/30/2023)   PRAPARE - Administrator, Civil Service (Medical): No    Lack of Transportation (Non-Medical): No  Physical Activity: Not on file  Stress: Not on file  Social Connections: Not on file  Intimate Partner Violence: Not At Risk (08/30/2023)   Humiliation, Afraid, Rape, and Kick questionnaire    Fear of  Current or Ex-Partner: No    Emotionally Abused: No    Physically Abused: No    Sexually Abused: No    Outpatient Encounter Medications as of 04/10/2024  Medication Sig   albuterol (VENTOLIN HFA) 108 (90 Base) MCG/ACT inhaler Inhale into the lungs every 6 (six) hours as needed for wheezing or shortness of breath.   amLODipine  (NORVASC ) 10 MG tablet Take 1 tablet (10 mg total) by mouth daily.   cholecalciferol (VITAMIN D3) 25 MCG (1000 UNIT) tablet Take 1 tablet (1,000 Units total) by mouth daily.   cyclobenzaprine  (FLEXERIL ) 10 MG tablet TAKE 1 TABLET BY MOUTH THREE TIMES DAILY AS NEEDED FOR MUSCLE SPASM (Patient taking differently: Take 10 mg by mouth 3 (three) times daily as needed for muscle spasms.)   fluticasone -salmeterol (ADVAIR HFA) 45-21 MCG/ACT inhaler Inhale 2 puffs into the lungs 2 (two) times daily.   Multiple Vitamin (MULTIVITAMIN WITH MINERALS) TABS tablet Take 1 tablet by mouth every morning.   naproxen  sodium (ALEVE ) 220 MG tablet Take 220 mg by mouth.   valsartan -hydrochlorothiazide  (DIOVAN  HCT) 320-25 MG tablet Take 1 tablet by mouth daily.   [DISCONTINUED] fenofibrate  (TRICOR ) 48 MG tablet Take 1 tablet (48 mg total) by mouth daily. **NEEDS TO BE SEEN BEFORE NEXT REFILL**   [DISCONTINUED] valsartan -hydrochlorothiazide  (DIOVAN -HCT) 160-12.5 MG tablet Take 1 tablet by mouth once daily   fenofibrate  (TRICOR ) 48 MG tablet Take 1 tablet (48 mg total) by mouth daily. **NEEDS TO BE SEEN BEFORE NEXT REFILL**   No facility-administered encounter medications on file as of 04/10/2024.    Allergies  Allergen Reactions   Ibuprofen Other (See Comments)    Interaction with kidney function   Lisinopril  Other (See Comments)    Interaction with kidney function    Pertinent ROS per HPI, otherwise unremarkable      Objective:  BP (!) 140/82   Pulse 95   Temp (!) 96.9 F (36.1 C) (Temporal)   Ht 5' 8 (1.727 m)   Wt 262 lb (118.8 kg)   SpO2 100%   BMI 39.84 kg/m    Wt  Readings from Last 3 Encounters:  04/10/24 262 lb (118.8 kg)  08/30/23 270 lb 9.6 oz (122.7 kg)  08/25/18 280 lb (127 kg)   BP Readings from Last 3 Encounters:  04/10/24 (!) 140/82  08/30/23 (!) 155/96  08/26/18 109/85    Physical Exam Vitals and nursing note reviewed.  Constitutional:      Appearance: He is obese.  HENT:     Head: Normocephalic and atraumatic.     Nose: Nose normal.  Eyes:     Extraocular Movements: Extraocular movements intact.  Conjunctiva/sclera: Conjunctivae normal.     Pupils: Pupils are equal, round, and reactive to light.  Cardiovascular:     Heart sounds: Normal heart sounds.  Pulmonary:     Effort: Pulmonary effort is normal.     Breath sounds: Normal breath sounds.  Musculoskeletal:        General: Normal range of motion.     Cervical back: Normal.     Thoracic back: Normal.     Lumbar back: Tenderness present.     Right lower leg: No edema.     Left lower leg: No edema.  Skin:    General: Skin is warm and dry.  Neurological:     Mental Status: He is alert and oriented to person, place, and time.  Psychiatric:        Mood and Affect: Mood normal.        Behavior: Behavior normal.        Thought Content: Thought content normal.        Judgment: Judgment normal.    Physical Exam VITALS: BP- 140/82 MEASUREMENTS: Weight- 262.     Results for orders placed or performed in visit on 08/30/23  Bayer DCA Hb A1c Waived   Collection Time: 08/30/23 11:10 AM  Result Value Ref Range   HB A1C (BAYER DCA - WAIVED) 5.2 4.8 - 5.6 %  CBC with Differential/Platelet   Collection Time: 08/30/23 11:11 AM  Result Value Ref Range   WBC 5.4 3.4 - 10.8 x10E3/uL   RBC 4.86 4.14 - 5.80 x10E6/uL   Hemoglobin 15.4 13.0 - 17.7 g/dL   Hematocrit 54.2 62.4 - 51.0 %   MCV 94 79 - 97 fL   MCH 31.7 26.6 - 33.0 pg   MCHC 33.7 31.5 - 35.7 g/dL   RDW 87.2 88.3 - 84.5 %   Platelets 242 150 - 450 x10E3/uL   Neutrophils 61 Not Estab. %   Lymphs 25 Not Estab. %    Monocytes 11 Not Estab. %   Eos 2 Not Estab. %   Basos 1 Not Estab. %   Neutrophils Absolute 3.3 1.4 - 7.0 x10E3/uL   Lymphocytes Absolute 1.4 0.7 - 3.1 x10E3/uL   Monocytes Absolute 0.6 0.1 - 0.9 x10E3/uL   EOS (ABSOLUTE) 0.1 0.0 - 0.4 x10E3/uL   Basophils Absolute 0.0 0.0 - 0.2 x10E3/uL   Immature Granulocytes 0 Not Estab. %   Immature Grans (Abs) 0.0 0.0 - 0.1 x10E3/uL  CMP14+EGFR   Collection Time: 08/30/23 11:11 AM  Result Value Ref Range   Glucose 108 (H) 70 - 99 mg/dL   BUN 7 6 - 24 mg/dL   Creatinine, Ser 9.16 0.76 - 1.27 mg/dL   eGFR 890 >40 fO/fpw/8.26   BUN/Creatinine Ratio 8 (L) 9 - 20   Sodium 137 134 - 144 mmol/L   Potassium 3.7 3.5 - 5.2 mmol/L   Chloride 99 96 - 106 mmol/L   CO2 24 20 - 29 mmol/L   Calcium  9.9 8.7 - 10.2 mg/dL   Total Protein 8.1 6.0 - 8.5 g/dL   Albumin 4.3 4.1 - 5.1 g/dL   Globulin, Total 3.8 1.5 - 4.5 g/dL   Bilirubin Total 1.1 0.0 - 1.2 mg/dL   Alkaline Phosphatase 107 44 - 121 IU/L   AST 96 (H) 0 - 40 IU/L   ALT 96 (H) 0 - 44 IU/L  Thyroid  Panel With TSH   Collection Time: 08/30/23 11:11 AM  Result Value Ref Range   TSH 2.120 0.450 - 4.500  uIU/mL   T4, Total 7.9 4.5 - 12.0 ug/dL   T3 Uptake Ratio 26 24 - 39 %   Free Thyroxine Index 2.1 1.2 - 4.9  Lipid panel   Collection Time: 08/30/23 11:11 AM  Result Value Ref Range   Cholesterol, Total 205 (H) 100 - 199 mg/dL   Triglycerides 777 (H) 0 - 149 mg/dL   HDL 53 >60 mg/dL   VLDL Cholesterol Cal 38 5 - 40 mg/dL   LDL Chol Calc (NIH) 885 (H) 0 - 99 mg/dL   Chol/HDL Ratio 3.9 0.0 - 5.0 ratio  PSA, total and free   Collection Time: 08/30/23 11:11 AM  Result Value Ref Range   Prostate Specific Ag, Serum 0.5 0.0 - 4.0 ng/mL   PSA, Free 0.11 N/A ng/mL   PSA, Free Pct 22.0 %  VITAMIN D  25 Hydroxy (Vit-D Deficiency, Fractures)   Collection Time: 08/30/23 11:11 AM  Result Value Ref Range   Vit D, 25-Hydroxy 27.6 (L) 30.0 - 100.0 ng/mL  Hepatic function panel   Collection Time:  08/30/23 11:11 AM  Result Value Ref Range   Total Protein 8.1 6.0 - 8.5 g/dL   Albumin 4.4 4.1 - 5.1 g/dL   Bilirubin Total 0.7 0.0 - 1.2 mg/dL   Bilirubin, Direct 9.66 0.00 - 0.40 mg/dL   Alkaline Phosphatase 107 44 - 121 IU/L   AST 94 (H) 0 - 40 IU/L   ALT 91 (H) 0 - 44 IU/L  Specimen status report   Collection Time: 08/30/23 11:11 AM  Result Value Ref Range   specimen status report Comment   Cologuard   Collection Time: 09/06/23  9:34 AM  Result Value Ref Range   COLOGUARD Negative Negative       Pertinent labs & imaging results that were available during my care of the patient were reviewed by me and considered in my medical decision making.  Assessment & Plan:  Jonathan Moses was seen today for medical management of chronic issues and medication refill.  Diagnoses and all orders for this visit:  Primary hypertension -     valsartan -hydrochlorothiazide  (DIOVAN  HCT) 320-25 MG tablet; Take 1 tablet by mouth daily.  Class 2 obesity without serious comorbidity with body mass index (BMI) of 39.0 to 39.9 in adult, unspecified obesity type -     fenofibrate  (TRICOR ) 48 MG tablet; Take 1 tablet (48 mg total) by mouth daily. **NEEDS TO BE SEEN BEFORE NEXT REFILL**  ETOH abuse  Vitamin D  deficiency -     cholecalciferol (VITAMIN D3) 25 MCG (1000 UNIT) tablet; Take 1 tablet (1,000 Units total) by mouth daily.  Chronic bilateral low back pain with bilateral sciatica  Mixed hyperlipidemia -     fenofibrate  (TRICOR ) 48 MG tablet; Take 1 tablet (48 mg total) by mouth daily. **NEEDS TO BE SEEN BEFORE NEXT REFILL**     Assessment and Plan Jonathan Moses is a 48 year old male seen today for chronic disease management, no acute distress Assessment & Plan Primary hypertension Blood pressure improved but not at target. On amlodipine  and valsartan -hydrochlorothiazide . - Increase Diovan -HCT dose to double current dose. - Advise taking two pills of current Diovan -HCT until new prescription is  filled. - Emphasize lifestyle modifications: reduce salt, avoid fried foods. - Follow up in 3 months to reassess blood pressure.  Chronic obstructive pulmonary disease (COPD) COPD diagnosed two years ago. Quit smoking a year ago. Uses albuterol inhaler as needed. - Ensure albuterol inhaler is refilled as needed.  Chronic low back pain with sciatica  Chronic low back pain with sciatica managed with cyclobenzaprine . - Refill cyclobenzaprine  prescription. - Advise on proper lifting techniques and importance of assistance when lifting.  Morbid obesity Weight decreased from 270 to 262 pounds. Not on weight loss medication. - Encourage weight loss through dietary changes, including reducing salt and fried food intake.  Alcohol use disorder Daily alcohol consumption reported. No engagement in counseling or therapy.  Vitamin D  deficiency Persistent vitamin D  deficiency. Multivitamin insufficient. - Prescribe vitamin D  supplement at 1000 units daily.      Continue all other maintenance medications.  Follow up plan: Return in about 3 months (around 07/11/2024) for chronic diseases management.   Continue healthy lifestyle choices, including diet (rich in fruits, vegetables, and lean proteins, and low in salt and simple carbohydrates) and exercise (at least 30 minutes of moderate physical activity daily).  Educational handout given for    Clinical References  Chronic Back Pain Chronic back pain is back pain that lasts longer than 3 months. The pain may get worse at certain times (flare-ups). There are things you can do at home to manage your pain. Follow these instructions at home: Watch for any changes in your symptoms. Take these actions to help with your pain: Managing pain and stiffness     If told, put ice on the painful area. You may be told to use ice for 24-48 hours after a flare-up starts. Put ice in a plastic bag. Place a towel between your skin and the bag. Leave the  ice on for 20 minutes, 2-3 times a day. If told, put heat on the painful area. Do this as often as told by your doctor. Use the heat source that your doctor recommends, such as a moist heat pack or a heating pad. Place a towel between your skin and the heat source. Leave the heat on for 20-30 minutes. If your skin turns bright red, take off the ice or heat right away to prevent skin damage. The risk of damage is higher if you cannot feel pain, heat, or cold. Soak in a warm bath. This can help with pain. Activity        Avoid bending and other activities that make the pain worse. When you stand: Keep your upper back and neck straight. Keep your shoulders pulled back. Avoid slouching. When you sit: Keep your back straight. Relax your shoulders. Do not round your shoulders or pull them backward. Do not sit or stand in one place for too long. Take short rest breaks during the day. Lying down or standing is often better than sitting. Resting can help relieve pain. When sitting or lying down for a long time, do some mild activity or stretching. This will help to prevent stiffness and pain. Get regular exercise. Ask your doctor what activities are safe for you. You may have to avoid lifting. Ask your provider how much you can safely lift. If you lift things: Bend your knees. Keep the weight close to your body. Avoid twisting. Medicines Take over-the-counter and prescription medicines only as told by your doctor. You may need to take medicines for pain and swelling. These may be taken by mouth or put on the skin. You may also be given muscle relaxants. Ask your doctor if the medicine prescribed to you: Requires you to avoid driving or using machinery. Can cause trouble pooping (constipation). You may need to take these actions to prevent or treat trouble pooping: Drink enough fluid to keep your pee (urine) pale yellow.  Take over-the-counter or prescription medicines. Eat foods that are  high in fiber. These include beans, whole grains, and fresh fruits and vegetables. Limit foods that are high in fat and sugars. These include fried or sweet foods. General instructions  Sleep on a firm mattress. Try lying on your side with your knees slightly bent. If you lie on your back, put a pillow under your knees. Do not smoke or use any products that contain nicotine or tobacco. If you need help quitting, ask your doctor. Contact a doctor if: Your pain does not get better with rest or medicine. You have new pain. You have a fever. You lose weight quickly. You have trouble doing your normal activities. One or both of your legs or feet feel weak. One or both of your legs or feet lose feeling (have numbness). Get help right away if: You are not able to control when you pee or poop. You have bad back pain and: You feel like you may vomit (nauseous). You vomit. You have pain in your chest or your belly (abdomen). You have shortness of breath. You faint. These symptoms may be an emergency. Get help right away. Call 911. Do not wait to see if the symptoms will go away. Do not drive yourself to the hospital. This information is not intended to replace advice given to you by your health care provider. Make sure you discuss any questions you have with your health care provider. Document Revised: 01/26/2022 Document Reviewed: 01/26/2022 Elsevier Patient Education  2024 [employer] Managing Chronic Back Pain Chronic back pain is pain that lasts longer than 3 months. It often affects the lower back. It may feel like a muscle ache or a sharp, stabbing pain. It can be mild, moderate, or severe. There are things you can do to help manage your pain. See what works best for you. Your health care provider may also give you other instructions. What actions can I take to manage my chronic back pain? You may be given a treatment plan by your provider. Treatment often starts with rest and pain  relief. It may also include: Physical therapy. These are exercises to help restore movement and strength to your back. Techniques to help you relax. Counseling or therapy. Cognitive behavioral therapy (CBT) is a form of therapy that helps you set goals and make changes. Acupuncture or massage therapy. Local electrical stimulation. Injections. You may be given medicines to numb an area or relieve pain. If other treatments do not help, you may need surgery. How to use body mechanics and posture to help with pain You can help relieve stress on your back with good posture and healthy body mechanics. Body mechanics are all the ways your body moves during the day. Posture is part of body mechanics. Good posture means: Your spine is in its correct S-curve, or neutral, position. Your shoulders are pulled back a bit. Your head is not tipped forward. To improve your posture and body mechanics, follow these guidelines. Standing  When standing, keep your feet about hip-width apart. Keep your knees slightly bent. Your ears, shoulders, and hips should line up. Your spine should be neutral. When you stand in one place for a long time, place one foot on a stable object that is 2-4 inches (5-10 cm) high, such as a footstool. Sitting  When sitting, keep your feet flat on the floor. Use a footrest, if needed. Keep your thighs parallel to the floor. Try not to round your shoulders or tilt  your head forward. When working at a desk or a computer: Position your desk so your hands are a little lower than your elbows. Slide your chair under your desk so you are close enough to have good posture. Position your monitor so you are looking straight ahead and do not have to tilt your head to view the screen. Lifting  Keep your feet shoulder-width apart. Tighten the muscles of your abdomen. Bend your knees and hips. Keep your spine neutral. Lift using the strength of your legs, not your back. Do not lock your knees  straight out. Ask for help to lift heavy or awkward objects. Resting  Do not lie down in a way that causes pain. If you have pain when you sit, bend, stoop, or squat, lie in a way that your body does not bend much. Try not to curl up on your side with your arms and knees near your chest (fetal position). If it hurts to stand for a long time or reach with your arms, lie with your spine neutral and knees bent slightly. Try lying: On your side with a pillow between your knees. On your back with a pillow under your knees. How to recognize changes in your chronic back pain Let your provider know if your pain gets worse or does not get better with treatment. Your back pain may be getting worse if you have pain that: Starts to cause problems with your posture. Gets worse when you sit, stand, walk, bend, or lift things. Happens when you are active, at rest, or both. Makes it hard for you to move around (limits mobility). Occurs with fever, weight loss, or trouble peeing (urinating). Causes numbness and tingling. Follow these instructions at home: Medicines You may need to take medicines for pain and inflammation. These may be taken by mouth or put on the skin. You may also be given muscle relaxants. Take over-the-counter and prescription medicines only as told by your provider. Ask your provider if the medicine prescribed to you: Requires you to avoid driving or using machinery. Can cause constipation. You may need to take these actions to prevent or treat constipation: Drink enough fluid to keep your pee (urine) pale yellow. Take over-the-counter or prescription medicines. Eat foods that are high in fiber, such as beans, whole grains, and fresh fruits and vegetables. Limit foods that are high in fat and processed sugars, such as fried or sweet foods. Lifestyle Do not use any products that contain nicotine or tobacco. These products include cigarettes, chewing tobacco, and vaping devices, such  as e-cigarettes. If you need help quitting, ask your provider. Eat a healthy diet. Eat lots of vegetables, fruits, fish, and lean meats. Work with your provider to stay at a healthy weight. General instructions Get regular exercise as told. Exercise can help with flexibility and strength. If physical therapy was prescribed, do exercises as told by your provider. Use ice or heat therapy as told by your provider. Where can I get support? Think about joining a support group for people with chronic back pain. You can find some groups at: Pain Connection Program: painconnection.org The American Chronic Pain Association: acpanow.com Contact a health care provider if: Your pain does not get better with rest or medicine. You have new pain. You have a fever. You lose weight quickly. You have trouble doing your normal activities. You feel weak or numb in one or both of your legs or feet. Get help right away if: You are not able to control  when you pee or poop. You have severe back pain and: Nausea or vomiting. Pain in your chest or abdomen. Shortness of breath. You faint. These symptoms may be an emergency. Get help right away. Call 911. Do not wait to see if the symptoms will go away. Do not drive yourself to the hospital. This information is not intended to replace advice given to you by your health care provider. Make sure you discuss any questions you have with your health care provider. Document Revised: 01/26/2022 Document Reviewed: 01/26/2022 Elsevier Patient Education  2024 [employer] Inc. Dyslipidemia Dyslipidemia is an imbalance of waxy, fat-like substances (lipids) in the blood. The body needs lipids in small amounts. Dyslipidemia often involves a high level of cholesterol or triglycerides, which are types of lipids. Common forms of dyslipidemia include: High levels of LDL cholesterol. LDL is the type of cholesterol that causes fatty deposits (plaques) to build up in the blood  vessels that carry blood away from the heart (arteries). Low levels of HDL cholesterol. HDL cholesterol is the type of cholesterol that protects against heart disease. High levels of HDL remove the LDL buildup from arteries. High levels of triglycerides. Triglycerides are a fatty substance in the blood that is linked to a buildup of plaques in the arteries. What are the causes? There are two main types of dyslipidemia: primary and secondary. Primary dyslipidemia is caused by changes (mutations) in genes that are passed down through families (inherited). These mutations cause several types of dyslipidemia. Secondary dyslipidemia may be caused by various risk factors that can lead to the disease, such as lifestyle choices and certain medical conditions. What increases the risk? You are more likely to develop this condition if you are an older man or if you are a woman who has gone through menopause. Other risk factors include: Having a family history of dyslipidemia. Taking certain medicines, including birth control pills, steroids, some diuretics, and beta-blockers. Eating a diet high in saturated fat. Smoking cigarettes or excessive alcohol intake. Having certain medical conditions such as diabetes, polycystic ovary syndrome (PCOS), kidney disease, liver disease, or hypothyroidism. Not exercising regularly. Being overweight or obese with too much belly fat. What are the signs or symptoms? In most cases, dyslipidemia does not usually cause any symptoms. In severe cases, very high lipid levels can cause: Fatty bumps under the skin (xanthomas). A white or gray ring around the black center (pupil) of the eye. Very high triglyceride levels can cause inflammation of the pancreas (pancreatitis). How is this diagnosed? Your health care provider may diagnose dyslipidemia based on a routine blood test (fasting blood test). Because most people do not have symptoms of the condition, this blood testing  (lipid profile) is done on adults age 61 and older and is repeated every 4-6 years. This test checks: Total cholesterol. This measures the total amount of cholesterol in your blood, including LDL cholesterol, HDL cholesterol, and triglycerides. A healthy number is below 200 mg/dL (4.82 mmol/L). LDL cholesterol. The target number for LDL cholesterol is different for each person, depending on individual risk factors. A healthy number is usually below 100 mg/dL (7.40 mmol/L). Ask your health care provider what your LDL cholesterol should be. HDL cholesterol. An HDL level of 60 mg/dL (8.44 mmol/L) or higher is best because it helps to protect against heart disease. A number below 40 mg/dL (8.96 mmol/L) for men or below 50 mg/dL (8.70 mmol/L) for women increases the risk for heart disease. Triglycerides. A healthy triglyceride number is below 150  mg/dL (8.30 mmol/L). If your lipid profile is abnormal, your health care provider may do other blood tests. How is this treated? Treatment depends on the type of dyslipidemia that you have and your other risk factors for heart disease and stroke. Your health care provider will have a target range for your lipid levels based on this information. Treatment for dyslipidemia starts with lifestyle changes, such as diet and exercise. Your health care provider may recommend that you: Get regular exercise. Make changes to your diet. Quit smoking if you smoke. Limit your alcohol intake. If diet changes and exercise do not help you reach your goals, your health care provider may also prescribe medicine to lower lipids. The most commonly prescribed type of medicine lowers your LDL cholesterol (statin drug). If you have a high triglyceride level, your provider may prescribe another type of drug (fibrate) or an omega-3 fish oil supplement, or both. Follow these instructions at home: Eating and drinking  Follow instructions from your health care provider or dietitian about  eating or drinking restrictions. Eat a healthy diet as told by your health care provider. This can help you reach and maintain a healthy weight, lower your LDL cholesterol, and raise your HDL cholesterol. This may include: Limiting your calories, if you are overweight. Eating more fruits, vegetables, whole grains, fish, and lean meats. Limiting saturated fat, trans fat, and cholesterol. Do not drink alcohol if: Your health care provider tells you not to drink. You are pregnant, may be pregnant, or are planning to become pregnant. If you drink alcohol: Limit how much you have to: 0-1 drink a day for women. 0-2 drinks a day for men. Know how much alcohol is in your drink. In the [place] one drink equals one 12 oz bottle of beer (355 mL), one 5 oz glass of wine (148 mL), or one 1 oz glass of hard liquor (44 mL). Activity Get regular exercise. Start an exercise and strength training program as told by your health care provider. Ask your health care provider what activities are safe for you. Your health care provider may recommend: 30 minutes of aerobic activity 4-6 days a week. Brisk walking is an example of aerobic activity. Strength training 2 days a week. General instructions Do not use any products that contain nicotine or tobacco. These products include cigarettes, chewing tobacco, and vaping devices, such as e-cigarettes. If you need help quitting, ask your health care provider. Take over-the-counter and prescription medicines only as told by your health care provider. This includes supplements. Keep all follow-up visits. This is important. Contact a health care provider if: You are having trouble sticking to your exercise or diet plan. You are struggling to quit smoking or to control your use of alcohol. Summary Dyslipidemia often involves a high level of cholesterol or triglycerides, which are types of lipids. Treatment depends on the type of dyslipidemia that you have and your other  risk factors for heart disease and stroke. Treatment for dyslipidemia starts with lifestyle changes, such as diet and exercise. Your health care provider may prescribe medicine to lower lipids. This information is not intended to replace advice given to you by your health care provider. Make sure you discuss any questions you have with your health care provider. Document Revised: 01/09/2022 Document Reviewed: 08/12/2020 Elsevier Patient Education  2025 [employer]. Hypertension, Adult High blood pressure (hypertension) is when the force of blood pumping through the arteries is too strong. The arteries are the blood vessels that carry blood from  the heart throughout the body. Hypertension forces the heart to work harder to pump blood and may cause arteries to become narrow or stiff. Untreated or uncontrolled hypertension can lead to a heart attack, heart failure, a stroke, kidney disease, and other problems. A blood pressure reading consists of a higher number over a lower number. Ideally, your blood pressure should be below 120/80. The first (top) number is called the systolic pressure. It is a measure of the pressure in your arteries as your heart beats. The second (bottom) number is called the diastolic pressure. It is a measure of the pressure in your arteries as the heart relaxes. What are the causes? The exact cause of this condition is not known. There are some conditions that result in high blood pressure. What increases the risk? Certain factors may make you more likely to develop high blood pressure. Some of these risk factors are under your control, including: Smoking. Not getting enough exercise or physical activity. Being overweight. Having too much fat, sugar, calories, or salt (sodium) in your diet. Drinking too much alcohol. Other risk factors include: Having a personal history of heart disease, diabetes, high cholesterol, or kidney disease. Stress. Having a family history  of high blood pressure and high cholesterol. Having obstructive sleep apnea. Age. The risk increases with age. What are the signs or symptoms? High blood pressure may not cause symptoms. Very high blood pressure (hypertensive crisis) may cause: Headache. Fast or irregular heartbeats (palpitations). Shortness of breath. Nosebleed. Nausea and vomiting. Vision changes. Severe chest pain, dizziness, and seizures. How is this diagnosed? This condition is diagnosed by measuring your blood pressure while you are seated, with your arm resting on a flat surface, your legs uncrossed, and your feet flat on the floor. The cuff of the blood pressure monitor will be placed directly against the skin of your upper arm at the level of your heart. Blood pressure should be measured at least twice using the same arm. Certain conditions can cause a difference in blood pressure between your right and left arms. If you have a high blood pressure reading during one visit or you have normal blood pressure with other risk factors, you may be asked to: Return on a different day to have your blood pressure checked again. Monitor your blood pressure at home for 1 week or longer. If you are diagnosed with hypertension, you may have other blood or imaging tests to help your health care provider understand your overall risk for other conditions. How is this treated? This condition is treated by making healthy lifestyle changes, such as eating healthy foods, exercising more, and reducing your alcohol intake. You may be referred for counseling on a healthy diet and physical activity. Your health care provider may prescribe medicine if lifestyle changes are not enough to get your blood pressure under control and if: Your systolic blood pressure is above 130. Your diastolic blood pressure is above 80. Your personal target blood pressure may vary depending on your medical conditions, your age, and other factors. Follow these  instructions at home: Eating and drinking  Eat a diet that is high in fiber and potassium, and low in sodium, added sugar, and fat. An example of this eating plan is called the DASH diet. DASH stands for Dietary Approaches to Stop Hypertension. To eat this way: Eat plenty of fresh fruits and vegetables. Try to fill one half of your plate at each meal with fruits and vegetables. Eat whole grains, such as whole-wheat pasta,  brown rice, or whole-grain bread. Fill about one fourth of your plate with whole grains. Eat or drink low-fat dairy products, such as skim milk or low-fat yogurt. Avoid fatty cuts of meat, processed or cured meats, and poultry with skin. Fill about one fourth of your plate with lean proteins, such as fish, chicken without skin, beans, eggs, or tofu. Avoid pre-made and processed foods. These tend to be higher in sodium, added sugar, and fat. Reduce your daily sodium intake. Many people with hypertension should eat less than 1,500 mg of sodium a day. Do not drink alcohol if: Your health care provider tells you not to drink. You are pregnant, may be pregnant, or are planning to become pregnant. If you drink alcohol: Limit how much you have to: 0-1 drink a day for women. 0-2 drinks a day for men. Know how much alcohol is in your drink. In the [place] one drink equals one 12 oz bottle of beer (355 mL), one 5 oz glass of wine (148 mL), or one 1 oz glass of hard liquor (44 mL). Lifestyle  Work with your health care provider to maintain a healthy body weight or to lose weight. Ask what an ideal weight is for you. Get at least 30 minutes of exercise that causes your heart to beat faster (aerobic exercise) most days of the week. Activities may include walking, swimming, or biking. Include exercise to strengthen your muscles (resistance exercise), such as Pilates or lifting weights, as part of your weekly exercise routine. Try to do these types of exercises for 30 minutes at least 3 days  a week. Do not use any products that contain nicotine or tobacco. These products include cigarettes, chewing tobacco, and vaping devices, such as e-cigarettes. If you need help quitting, ask your health care provider. Monitor your blood pressure at home as told by your health care provider. Keep all follow-up visits. This is important. Medicines Take over-the-counter and prescription medicines only as told by your health care provider. Follow directions carefully. Blood pressure medicines must be taken as prescribed. Do not skip doses of blood pressure medicine. Doing this puts you at risk for problems and can make the medicine less effective. Ask your health care provider about side effects or reactions to medicines that you should watch for. Contact a health care provider if you: Think you are having a reaction to a medicine you are taking. Have headaches that keep coming back (recurring). Feel dizzy. Have swelling in your ankles. Have trouble with your vision. Get help right away if you: Develop a severe headache or confusion. Have unusual weakness or numbness. Feel faint. Have severe pain in your chest or abdomen. Vomit repeatedly. Have trouble breathing. These symptoms may be an emergency. Get help right away. Call 911. Do not wait to see if the symptoms will go away. Do not drive yourself to the hospital. Summary Hypertension is when the force of blood pumping through your arteries is too strong. If this condition is not controlled, it may put you at risk for serious complications. Your personal target blood pressure may vary depending on your medical conditions, your age, and other factors. For most people, a normal blood pressure is less than 120/80. Hypertension is treated with lifestyle changes, medicines, or a combination of both. Lifestyle changes include losing weight, eating a healthy, low-sodium diet, exercising more, and limiting alcohol. This information is not intended  to replace advice given to you by your health care provider. Make sure you discuss any  questions you have with your health care provider. Document Revised: 04/15/2021 Document Reviewed: 04/15/2021 Elsevier Patient Education  2024 [employer] Vitamin D  Deficiency Vitamin D  deficiency is when your body does not have enough vitamin D . Vitamin D  is important because: It helps your body use certain minerals. It helps to keep your bones healthy. It lessens irritation and swelling (inflammation). It helps the body's defense system (immune system) work better. Not getting enough vitamin D  can make your bones soft. What are the causes? Not eating enough foods that have vitamin D  in them. Not getting enough sun. Having diseases that make it hard for your body to take in vitamin D . Having had part of your stomach or part of your small intestine taken out. What increases the risk? Being an older adult. Not spending much time outdoors. Living in a long-term care center. Having dark skin. Taking certain medicines. Being overweight or very overweight (obese). Having long-term (chronic) kidney or liver disease. What are the signs or symptoms? In mild cases, there may be no symptoms. If the condition is very bad, symptoms may include: Bone pain. Muscle pain. Not being able to walk normally. Bones that break easily. Joint pain. How is this treated? Treatment may include taking supplements as told by your doctor. Your doctor will tell you what dose is best for you. This may include taking: Vitamin D . Calcium . Follow these instructions at home: Eating and drinking Eat foods that have vitamin D  in them, such as: Dairy products, cereals, or juices that have vitamin D  added to them (are fortified). Check the label. Fish, such as salmon or trout. Eggs. The vitamin D  is in the yolk. Mushrooms that were treated with UV light. Beef liver. The items listed above may not be a complete list of foods  and beverages you can eat and drink. Contact a dietitian for more information. General instructions Take over-the-counter and prescription medicines only as told by your doctor. Take supplements only as told by your doctor. Get sunlight in a safe way. Do not use a tanning bed. Stay at a healthy weight. Lose weight if you need to. Keep all follow-up visits. How is this prevented? Eating foods that naturally have vitamin D  in them. Eating or drinking foods and drinks that have vitamin D  added to them, such as cereals, juices, and milk. Taking vitamin D  or a multivitamin that has vitamin D  in it. Being in the sun. Your body makes vitamin D  when your skin gets sunlight. Contact a doctor if: Your symptoms do not go away. You feel like you may vomit (nauseous). You vomit. You poop less often than normal, or you have trouble pooping (constipation). Summary Vitamin D  deficiency is when your body does not have enough vitamin D . Vitamin D  helps to keep your bones healthy. This condition is often treated by taking a supplement. Your doctor will tell you what dose is best for you. This information is not intended to replace advice given to you by your health care provider. Make sure you discuss any questions you have with your health care provider. Document Revised: 03/14/2021 Document Reviewed: 03/14/2021 Elsevier Patient Education  2024 [employer]  The above assessment and management plan was discussed with the patient. The patient verbalized understanding of and has agreed to the management plan. Patient is aware to call the clinic if they develop any new symptoms or if symptoms persist or worsen. Patient is aware when to return to the clinic for a follow-up visit. Patient educated  on when it is appropriate to go to the emergency department.  @SIGNATURE @

## 2024-04-10 ENCOUNTER — Encounter: Payer: Self-pay | Admitting: Nurse Practitioner

## 2024-04-10 ENCOUNTER — Ambulatory Visit: Admitting: Nurse Practitioner

## 2024-04-10 VITALS — BP 140/82 | HR 95 | Temp 96.9°F | Ht 68.0 in | Wt 262.0 lb

## 2024-04-10 DIAGNOSIS — E559 Vitamin D deficiency, unspecified: Secondary | ICD-10-CM

## 2024-04-10 DIAGNOSIS — E66812 Obesity, class 2: Secondary | ICD-10-CM | POA: Insufficient documentation

## 2024-04-10 DIAGNOSIS — I10 Essential (primary) hypertension: Secondary | ICD-10-CM | POA: Diagnosis not present

## 2024-04-10 DIAGNOSIS — M5441 Lumbago with sciatica, right side: Secondary | ICD-10-CM

## 2024-04-10 DIAGNOSIS — F1010 Alcohol abuse, uncomplicated: Secondary | ICD-10-CM | POA: Diagnosis not present

## 2024-04-10 DIAGNOSIS — E782 Mixed hyperlipidemia: Secondary | ICD-10-CM

## 2024-04-10 DIAGNOSIS — M5442 Lumbago with sciatica, left side: Secondary | ICD-10-CM

## 2024-04-10 DIAGNOSIS — Z6839 Body mass index (BMI) 39.0-39.9, adult: Secondary | ICD-10-CM

## 2024-04-10 DIAGNOSIS — G8929 Other chronic pain: Secondary | ICD-10-CM

## 2024-04-10 MED ORDER — VITAMIN D 25 MCG (1000 UNIT) PO TABS: 1000.0000 [IU] | ORAL_TABLET | Freq: Every day | ORAL | 1 refills | Status: AC

## 2024-04-10 MED ORDER — VALSARTAN-HYDROCHLOROTHIAZIDE 320-25 MG PO TABS: 1.0000 | ORAL_TABLET | Freq: Every day | ORAL | 1 refills | Status: AC

## 2024-04-10 MED ORDER — FENOFIBRATE 48 MG PO TABS: 48.0000 mg | ORAL_TABLET | Freq: Every day | ORAL | 1 refills | Status: AC

## 2024-04-27 DIAGNOSIS — E785 Hyperlipidemia, unspecified: Secondary | ICD-10-CM | POA: Insufficient documentation

## 2024-04-27 DIAGNOSIS — I10 Essential (primary) hypertension: Secondary | ICD-10-CM | POA: Insufficient documentation

## 2024-04-27 DIAGNOSIS — F411 Generalized anxiety disorder: Secondary | ICD-10-CM | POA: Insufficient documentation

## 2024-04-27 DIAGNOSIS — Z8042 Family history of malignant neoplasm of prostate: Secondary | ICD-10-CM | POA: Insufficient documentation

## 2024-04-27 DIAGNOSIS — J449 Chronic obstructive pulmonary disease, unspecified: Secondary | ICD-10-CM | POA: Insufficient documentation

## 2024-07-11 ENCOUNTER — Ambulatory Visit: Payer: Self-pay | Admitting: Nurse Practitioner

## 2024-07-11 ENCOUNTER — Encounter: Payer: Self-pay | Admitting: Nurse Practitioner

## 2024-07-11 NOTE — Progress Notes (Deleted)
 "    Subjective:  Patient ID: Jonathan Moses, male    DOB: 1975/08/22, 49 y.o.   MRN: [id number]  Patient Care Team: Deitra Morton Sebastian Nena, NP as PCP - General (Nurse Practitioner)   Chief Complaint:  No chief complaint on file.   HPI: Jonathan Moses is a 49 y.o. male presenting on 07/11/2024 for No chief complaint on file.   Discussed the use of AI scribe software for clinical note transcription with the patient, who gave verbal consent to proceed.  History of Present Illness       Relevant past medical, surgical, family, and social history reviewed and updated as indicated.  Allergies and medications reviewed and updated. Data reviewed: Chart in Epic.   Past Medical History:  Diagnosis Date   Anxiety    Hypertension     Past Surgical History:  Procedure Laterality Date   ANKLE SURGERY Right    TONSILLECTOMY     TONSILLECTOMY      Social History   Socioeconomic History   Marital status: Married    Spouse name: Not on file   Number of children: Not on file   Years of education: Not on file   Highest education level: Not on file  Occupational History   Not on file  Tobacco Use   Smoking status: Former    Types: Cigarettes   Smokeless tobacco: Never  Vaping Use   Vaping status: Every Day  Substance and Sexual Activity   Alcohol use: Yes    Alcohol/week: 6.0 standard drinks of alcohol    Types: 6 Shots of liquor per week   Drug use: Yes    Types: Marijuana   Sexual activity: Yes  Other Topics Concern   Not on file  Social History Narrative   Not on file   Social Drivers of Health   Tobacco Use: Medium Risk (04/27/2024)   Received from [facility]   Patient History    Smoking Tobacco Use: Former    Smokeless Tobacco Use: Unknown    Passive Exposure: Not on file  Financial Resource Strain: Low Risk (04/27/2024)   Received from [facility]   Overall Financial Resource Strain (CARDIA)    How hard is it for you to pay for the very basics  like food, housing, medical care, and heating?: Not hard at all  Food Insecurity: No Food Insecurity (04/27/2024)   Received from [facility]   Epic    Within the past 12 months, you worried that your food would run out before you got the money to buy more.: Never true    Within the past 12 months, the food you bought just didn't last and you didn't have money to get more.: Never true  Transportation Needs: No Transportation Needs (04/27/2024)   Received from [facility]    In the past 12 months, has lack of transportation kept you from medical appointments or from getting medications?: No    In the past 12 months, has lack of transportation kept you from meetings, work, or from getting things needed for daily living?: No  Physical Activity: Not on file  Stress: Not on file  Social Connections: Not on file  Intimate Partner Violence: Not At Risk (08/30/2023)   Humiliation, Afraid, Rape, and Kick questionnaire    Fear of Current or Ex-Partner: No    Emotionally Abused: No    Physically Abused: No    Sexually Abused: No  Depression (PHQ2-9): Low Risk (04/10/2024)  Depression (PHQ2-9)    PHQ-2 Score: 0  Alcohol Screen: Medium Risk (08/30/2023)   Alcohol Screen    Last Alcohol Screening Score (AUDIT): 10  Housing: Low Risk (04/27/2024)   Received from [facility]    In the last 12 months, was there a time when you were not able to pay the mortgage or rent on time?: No    In the past 12 months, how many times have you moved where you were living?: 0    At any time in the past 12 months, were you homeless or living in a shelter (including now)?: No  Utilities: Not At Risk (04/27/2024)   Received from [facility]    In the past 12 months has the electric, gas, oil, or water company threatened to shut off services in your home?: No  Health Literacy: Not on file    Outpatient Encounter Medications as of 07/11/2024  Medication Sig   albuterol (VENTOLIN HFA) 108  (90 Base) MCG/ACT inhaler Inhale into the lungs every 6 (six) hours as needed for wheezing or shortness of breath.   amLODipine  (NORVASC ) 10 MG tablet Take 1 tablet (10 mg total) by mouth daily.   cholecalciferol (VITAMIN D3) 25 MCG (1000 UNIT) tablet Take 1 tablet (1,000 Units total) by mouth daily.   cyclobenzaprine  (FLEXERIL ) 10 MG tablet TAKE 1 TABLET BY MOUTH THREE TIMES DAILY AS NEEDED FOR MUSCLE SPASM (Patient taking differently: Take 10 mg by mouth 3 (three) times daily as needed for muscle spasms.)   fenofibrate  (TRICOR ) 48 MG tablet Take 1 tablet (48 mg total) by mouth daily. **NEEDS TO BE SEEN BEFORE NEXT REFILL**   fluticasone -salmeterol (ADVAIR HFA) 45-21 MCG/ACT inhaler Inhale 2 puffs into the lungs 2 (two) times daily.   Multiple Vitamin (MULTIVITAMIN WITH MINERALS) TABS tablet Take 1 tablet by mouth every morning.   naproxen  sodium (ALEVE ) 220 MG tablet Take 220 mg by mouth.   valsartan -hydrochlorothiazide  (DIOVAN  HCT) 320-25 MG tablet Take 1 tablet by mouth daily.   No facility-administered encounter medications on file as of 07/11/2024.    Allergies[1]  Pertinent ROS per HPI, otherwise unremarkable      Objective:  There were no vitals taken for this visit.   Wt Readings from Last 3 Encounters:  04/10/24 262 lb (118.8 kg)  08/30/23 270 lb 9.6 oz (122.7 kg)  08/25/18 280 lb (127 kg)    Physical Exam Physical Exam      Results for orders placed or performed in visit on 08/30/23  Bayer DCA Hb A1c Waived   Collection Time: 08/30/23 11:10 AM  Result Value Ref Range   HB A1C (BAYER DCA - WAIVED) 5.2 4.8 - 5.6 %  CBC with Differential/Platelet   Collection Time: 08/30/23 11:11 AM  Result Value Ref Range   WBC 5.4 3.4 - 10.8 x10E3/uL   RBC 4.86 4.14 - 5.80 x10E6/uL   Hemoglobin 15.4 13.0 - 17.7 g/dL   Hematocrit 54.2 62.4 - 51.0 %   MCV 94 79 - 97 fL   MCH 31.7 26.6 - 33.0 pg   MCHC 33.7 31.5 - 35.7 g/dL   RDW 87.2 88.3 - 84.5 %   Platelets 242 150 - 450  x10E3/uL   Neutrophils 61 Not Estab. %   Lymphs 25 Not Estab. %   Monocytes 11 Not Estab. %   Eos 2 Not Estab. %   Basos 1 Not Estab. %   Neutrophils Absolute 3.3 1.4 - 7.0 x10E3/uL  Lymphocytes Absolute 1.4 0.7 - 3.1 x10E3/uL   Monocytes Absolute 0.6 0.1 - 0.9 x10E3/uL   EOS (ABSOLUTE) 0.1 0.0 - 0.4 x10E3/uL   Basophils Absolute 0.0 0.0 - 0.2 x10E3/uL   Immature Granulocytes 0 Not Estab. %   Immature Grans (Abs) 0.0 0.0 - 0.1 x10E3/uL  CMP14+EGFR   Collection Time: 08/30/23 11:11 AM  Result Value Ref Range   Glucose 108 (H) 70 - 99 mg/dL   BUN 7 6 - 24 mg/dL   Creatinine, Ser 9.16 0.76 - 1.27 mg/dL   eGFR 890 >40 fO/fpw/8.26   BUN/Creatinine Ratio 8 (L) 9 - 20   Sodium 137 134 - 144 mmol/L   Potassium 3.7 3.5 - 5.2 mmol/L   Chloride 99 96 - 106 mmol/L   CO2 24 20 - 29 mmol/L   Calcium  9.9 8.7 - 10.2 mg/dL   Total Protein 8.1 6.0 - 8.5 g/dL   Albumin 4.3 4.1 - 5.1 g/dL   Globulin, Total 3.8 1.5 - 4.5 g/dL   Bilirubin Total 1.1 0.0 - 1.2 mg/dL   Alkaline Phosphatase 107 44 - 121 IU/L   AST 96 (H) 0 - 40 IU/L   ALT 96 (H) 0 - 44 IU/L  Thyroid  Panel With TSH   Collection Time: 08/30/23 11:11 AM  Result Value Ref Range   TSH 2.120 0.450 - 4.500 uIU/mL   T4, Total 7.9 4.5 - 12.0 ug/dL   T3 Uptake Ratio 26 24 - 39 %   Free Thyroxine Index 2.1 1.2 - 4.9  Lipid panel   Collection Time: 08/30/23 11:11 AM  Result Value Ref Range   Cholesterol, Total 205 (H) 100 - 199 mg/dL   Triglycerides 777 (H) 0 - 149 mg/dL   HDL 53 >60 mg/dL   VLDL Cholesterol Cal 38 5 - 40 mg/dL   LDL Chol Calc (NIH) 885 (H) 0 - 99 mg/dL   Chol/HDL Ratio 3.9 0.0 - 5.0 ratio  PSA, total and free   Collection Time: 08/30/23 11:11 AM  Result Value Ref Range   Prostate Specific Ag, Serum 0.5 0.0 - 4.0 ng/mL   PSA, Free 0.11 N/A ng/mL   PSA, Free Pct 22.0 %  VITAMIN D  25 Hydroxy (Vit-D Deficiency, Fractures)   Collection Time: 08/30/23 11:11 AM  Result Value Ref Range   Vit D, 25-Hydroxy 27.6 (L) 30.0  - 100.0 ng/mL  Hepatic function panel   Collection Time: 08/30/23 11:11 AM  Result Value Ref Range   Total Protein 8.1 6.0 - 8.5 g/dL   Albumin 4.4 4.1 - 5.1 g/dL   Bilirubin Total 0.7 0.0 - 1.2 mg/dL   Bilirubin, Direct 9.66 0.00 - 0.40 mg/dL   Alkaline Phosphatase 107 44 - 121 IU/L   AST 94 (H) 0 - 40 IU/L   ALT 91 (H) 0 - 44 IU/L  Specimen status report   Collection Time: 08/30/23 11:11 AM  Result Value Ref Range   specimen status report Comment   Cologuard   Collection Time: 09/06/23  9:34 AM  Result Value Ref Range   COLOGUARD Negative Negative       Pertinent labs & imaging results that were available during my care of the patient were reviewed by me and considered in my medical decision making.  Assessment & Plan:  There are no diagnoses linked to this encounter.   Assessment and Plan Assessment & Plan       Continue all other maintenance medications.  Follow up plan: No follow-ups on file.  Continue healthy lifestyle choices, including diet (rich in fruits, vegetables, and lean proteins, and low in salt and simple carbohydrates) and exercise (at least 30 minutes of moderate physical activity daily).  Educational handout given for ***  The above assessment and management plan was discussed with the patient. The patient verbalized understanding of and has agreed to the management plan. Patient is aware to call the clinic if they develop any new symptoms or if symptoms persist or worsen. Patient is aware when to return to the clinic for a follow-up visit. Patient educated on when it is appropriate to go to the emergency department.   Nena Cassis Morton Hummer, DNP [facility] [address] [place], [place] [address] [phone]       [1]  Allergies Allergen Reactions   Ibuprofen Other (See Comments)    Interaction with kidney function   Lisinopril  Other (See Comments)    Interaction with kidney function   "

## 2024-07-11 NOTE — Progress Notes (Deleted)
 "    Subjective:  Patient ID: Jonathan Moses, male    DOB: [birth year], 49 y.o.   MRN: [id number]  Patient Care Team: Deitra Morton Sebastian Nena, NP as PCP - General (Nurse Practitioner)   Chief Complaint:  No chief complaint on file.   HPI: Jonathan Moses is a 49 y.o. male presenting on 07/11/2024 for No chief complaint on file.   Discussed the use of AI scribe software for clinical note transcription with the patient, who gave verbal consent to proceed.  History of Present Illness       Relevant past medical, surgical, family, and social history reviewed and updated as indicated.  Allergies and medications reviewed and updated. Data reviewed: Chart in Epic.   Past Medical History:  Diagnosis Date   Anxiety    Hypertension     Past Surgical History:  Procedure Laterality Date   ANKLE SURGERY Right    TONSILLECTOMY     TONSILLECTOMY      Social History   Socioeconomic History   Marital status: Married    Spouse name: Not on file   Number of children: Not on file   Years of education: Not on file   Highest education level: Not on file  Occupational History   Not on file  Tobacco Use   Smoking status: Former    Types: Cigarettes   Smokeless tobacco: Never  Vaping Use   Vaping status: Every Day  Substance and Sexual Activity   Alcohol use: Yes    Alcohol/week: 6.0 standard drinks of alcohol    Types: 6 Shots of liquor per week   Drug use: Yes    Types: Marijuana   Sexual activity: Yes  Other Topics Concern   Not on file  Social History Narrative   Not on file   Social Drivers of Health   Tobacco Use: Medium Risk (04/27/2024)   Received from [facility]   Patient History    Smoking Tobacco Use: Former    Smokeless Tobacco Use: Unknown    Passive Exposure: Not on file  Financial Resource Strain: Low Risk (04/27/2024)   Received from [facility]   Overall Financial Resource Strain (CARDIA)    How hard is it for you to pay for the very basics  like food, housing, medical care, and heating?: Not hard at all  Food Insecurity: No Food Insecurity (04/27/2024)   Received from [facility]   Epic    Within the past 12 months, you worried that your food would run out before you got the money to buy more.: Never true    Within the past 12 months, the food you bought just didn't last and you didn't have money to get more.: Never true  Transportation Needs: No Transportation Needs (04/27/2024)   Received from [facility]    In the past 12 months, has lack of transportation kept you from medical appointments or from getting medications?: No    In the past 12 months, has lack of transportation kept you from meetings, work, or from getting things needed for daily living?: No  Physical Activity: Not on file  Stress: Not on file  Social Connections: Not on file  Intimate Partner Violence: Not At Risk (08/30/2023)   Humiliation, Afraid, Rape, and Kick questionnaire    Fear of Current or Ex-Partner: No    Emotionally Abused: No    Physically Abused: No    Sexually Abused: No  Depression (PHQ2-9): Low Risk (04/10/2024)  Depression (PHQ2-9)    PHQ-2 Score: 0  Alcohol Screen: Medium Risk (08/30/2023)   Alcohol Screen    Last Alcohol Screening Score (AUDIT): 10  Housing: Low Risk (04/27/2024)   Received from [facility]    In the last 12 months, was there a time when you were not able to pay the mortgage or rent on time?: No    In the past 12 months, how many times have you moved where you were living?: 0    At any time in the past 12 months, were you homeless or living in a shelter (including now)?: No  Utilities: Not At Risk (04/27/2024)   Received from [facility]    In the past 12 months has the electric, gas, oil, or water company threatened to shut off services in your home?: No  Health Literacy: Not on file    Outpatient Encounter Medications as of 07/11/2024  Medication Sig   albuterol (VENTOLIN HFA) 108  (90 Base) MCG/ACT inhaler Inhale into the lungs every 6 (six) hours as needed for wheezing or shortness of breath.   amLODipine  (NORVASC ) 10 MG tablet Take 1 tablet (10 mg total) by mouth daily.   cholecalciferol (VITAMIN D3) 25 MCG (1000 UNIT) tablet Take 1 tablet (1,000 Units total) by mouth daily.   cyclobenzaprine  (FLEXERIL ) 10 MG tablet TAKE 1 TABLET BY MOUTH THREE TIMES DAILY AS NEEDED FOR MUSCLE SPASM (Patient taking differently: Take 10 mg by mouth 3 (three) times daily as needed for muscle spasms.)   fenofibrate  (TRICOR ) 48 MG tablet Take 1 tablet (48 mg total) by mouth daily. **NEEDS TO BE SEEN BEFORE NEXT REFILL**   fluticasone -salmeterol (ADVAIR HFA) 45-21 MCG/ACT inhaler Inhale 2 puffs into the lungs 2 (two) times daily.   Multiple Vitamin (MULTIVITAMIN WITH MINERALS) TABS tablet Take 1 tablet by mouth every morning.   naproxen  sodium (ALEVE ) 220 MG tablet Take 220 mg by mouth.   valsartan -hydrochlorothiazide  (DIOVAN  HCT) 320-25 MG tablet Take 1 tablet by mouth daily.   No facility-administered encounter medications on file as of 07/11/2024.    Allergies[1]  Pertinent ROS per HPI, otherwise unremarkable      Objective:  There were no vitals taken for this visit.   Wt Readings from Last 3 Encounters:  04/10/24 262 lb (118.8 kg)  08/30/23 270 lb 9.6 oz (122.7 kg)  08/25/18 280 lb (127 kg)    Physical Exam Physical Exam      Results for orders placed or performed in visit on 08/30/23  Bayer DCA Hb A1c Waived   Collection Time: 08/30/23 11:10 AM  Result Value Ref Range   HB A1C (BAYER DCA - WAIVED) 5.2 4.8 - 5.6 %  CBC with Differential/Platelet   Collection Time: 08/30/23 11:11 AM  Result Value Ref Range   WBC 5.4 3.4 - 10.8 x10E3/uL   RBC 4.86 4.14 - 5.80 x10E6/uL   Hemoglobin 15.4 13.0 - 17.7 g/dL   Hematocrit 54.2 62.4 - 51.0 %   MCV 94 79 - 97 fL   MCH 31.7 26.6 - 33.0 pg   MCHC 33.7 31.5 - 35.7 g/dL   RDW 87.2 88.3 - 84.5 %   Platelets 242 150 - 450  x10E3/uL   Neutrophils 61 Not Estab. %   Lymphs 25 Not Estab. %   Monocytes 11 Not Estab. %   Eos 2 Not Estab. %   Basos 1 Not Estab. %   Neutrophils Absolute 3.3 1.4 - 7.0 x10E3/uL  Lymphocytes Absolute 1.4 0.7 - 3.1 x10E3/uL   Monocytes Absolute 0.6 0.1 - 0.9 x10E3/uL   EOS (ABSOLUTE) 0.1 0.0 - 0.4 x10E3/uL   Basophils Absolute 0.0 0.0 - 0.2 x10E3/uL   Immature Granulocytes 0 Not Estab. %   Immature Grans (Abs) 0.0 0.0 - 0.1 x10E3/uL  CMP14+EGFR   Collection Time: 08/30/23 11:11 AM  Result Value Ref Range   Glucose 108 (H) 70 - 99 mg/dL   BUN 7 6 - 24 mg/dL   Creatinine, Ser 9.16 0.76 - 1.27 mg/dL   eGFR 890 >40 fO/fpw/8.26   BUN/Creatinine Ratio 8 (L) 9 - 20   Sodium 137 134 - 144 mmol/L   Potassium 3.7 3.5 - 5.2 mmol/L   Chloride 99 96 - 106 mmol/L   CO2 24 20 - 29 mmol/L   Calcium  9.9 8.7 - 10.2 mg/dL   Total Protein 8.1 6.0 - 8.5 g/dL   Albumin 4.3 4.1 - 5.1 g/dL   Globulin, Total 3.8 1.5 - 4.5 g/dL   Bilirubin Total 1.1 0.0 - 1.2 mg/dL   Alkaline Phosphatase 107 44 - 121 IU/L   AST 96 (H) 0 - 40 IU/L   ALT 96 (H) 0 - 44 IU/L  Thyroid  Panel With TSH   Collection Time: 08/30/23 11:11 AM  Result Value Ref Range   TSH 2.120 0.450 - 4.500 uIU/mL   T4, Total 7.9 4.5 - 12.0 ug/dL   T3 Uptake Ratio 26 24 - 39 %   Free Thyroxine Index 2.1 1.2 - 4.9  Lipid panel   Collection Time: 08/30/23 11:11 AM  Result Value Ref Range   Cholesterol, Total 205 (H) 100 - 199 mg/dL   Triglycerides 777 (H) 0 - 149 mg/dL   HDL 53 >60 mg/dL   VLDL Cholesterol Cal 38 5 - 40 mg/dL   LDL Chol Calc (NIH) 885 (H) 0 - 99 mg/dL   Chol/HDL Ratio 3.9 0.0 - 5.0 ratio  PSA, total and free   Collection Time: 08/30/23 11:11 AM  Result Value Ref Range   Prostate Specific Ag, Serum 0.5 0.0 - 4.0 ng/mL   PSA, Free 0.11 N/A ng/mL   PSA, Free Pct 22.0 %  VITAMIN D  25 Hydroxy (Vit-D Deficiency, Fractures)   Collection Time: 08/30/23 11:11 AM  Result Value Ref Range   Vit D, 25-Hydroxy 27.6 (L) 30.0  - 100.0 ng/mL  Hepatic function panel   Collection Time: 08/30/23 11:11 AM  Result Value Ref Range   Total Protein 8.1 6.0 - 8.5 g/dL   Albumin 4.4 4.1 - 5.1 g/dL   Bilirubin Total 0.7 0.0 - 1.2 mg/dL   Bilirubin, Direct 9.66 0.00 - 0.40 mg/dL   Alkaline Phosphatase 107 44 - 121 IU/L   AST 94 (H) 0 - 40 IU/L   ALT 91 (H) 0 - 44 IU/L  Specimen status report   Collection Time: 08/30/23 11:11 AM  Result Value Ref Range   specimen status report Comment   Cologuard   Collection Time: 09/06/23  9:34 AM  Result Value Ref Range   COLOGUARD Negative Negative       Pertinent labs & imaging results that were available during my care of the patient were reviewed by me and considered in my medical decision making.  Assessment & Plan:  There are no diagnoses linked to this encounter.   Assessment and Plan Assessment & Plan       Continue all other maintenance medications.  Follow up plan: No follow-ups on file.  Continue healthy lifestyle choices, including diet (rich in fruits, vegetables, and lean proteins, and low in salt and simple carbohydrates) and exercise (at least 30 minutes of moderate physical activity daily).  Educational handout given for ***  The above assessment and management plan was discussed with the patient. The patient verbalized understanding of and has agreed to the management plan. Patient is aware to call the clinic if they develop any new symptoms or if symptoms persist or worsen. Patient is aware when to return to the clinic for a follow-up visit. Patient educated on when it is appropriate to go to the emergency department.   Nena Cassis Morton Hummer, DNP [facility] [address] [place], [place] [address] [phone]      [1]  Allergies Allergen Reactions   Ibuprofen Other (See Comments)    Interaction with kidney function   Lisinopril  Other (See Comments)    Interaction with kidney function   "
